# Patient Record
Sex: Male | Born: 1965 | Race: Black or African American | Hispanic: No | Marital: Single | Smoking: Never smoker
Health system: Southern US, Community
[De-identification: ages and names within clinical notes are randomized; demographics above are authoritative.]

## PROBLEM LIST (undated history)

## (undated) DIAGNOSIS — I480 Paroxysmal atrial fibrillation: Secondary | ICD-10-CM

## (undated) DIAGNOSIS — I469 Cardiac arrest, cause unspecified: Secondary | ICD-10-CM

## (undated) DIAGNOSIS — I10 Essential (primary) hypertension: Secondary | ICD-10-CM

## (undated) DIAGNOSIS — N289 Disorder of kidney and ureter, unspecified: Secondary | ICD-10-CM

## (undated) DIAGNOSIS — I509 Heart failure, unspecified: Secondary | ICD-10-CM

## (undated) DIAGNOSIS — I428 Other cardiomyopathies: Secondary | ICD-10-CM

## (undated) HISTORY — PX: CARDIAC DEFIBRILLATOR PLACEMENT: SHX171

---

## 2017-04-16 ENCOUNTER — Ambulatory Visit: Payer: Self-pay | Admitting: Physician Assistant

## 2017-04-16 DIAGNOSIS — Z0189 Encounter for other specified special examinations: Secondary | ICD-10-CM

## 2020-07-18 ENCOUNTER — Emergency Department (HOSPITAL_COMMUNITY): Payer: Medicare Other

## 2020-07-18 ENCOUNTER — Emergency Department (HOSPITAL_COMMUNITY)
Admission: EM | Admit: 2020-07-18 | Discharge: 2020-07-18 | Disposition: A | Discharge status: Home / Self Care | Payer: Medicare Other | Attending: Emergency Medicine | Admitting: Emergency Medicine

## 2020-07-18 ENCOUNTER — Other Ambulatory Visit: Payer: Self-pay

## 2020-07-18 ENCOUNTER — Encounter (HOSPITAL_COMMUNITY): Payer: Self-pay | Admitting: Physician Assistant

## 2020-07-18 DIAGNOSIS — I130 Hypertensive heart and chronic kidney disease with heart failure and stage 1 through stage 4 chronic kidney disease, or unspecified chronic kidney disease: Secondary | ICD-10-CM | POA: Insufficient documentation

## 2020-07-18 DIAGNOSIS — Z20822 Contact with and (suspected) exposure to covid-19: Secondary | ICD-10-CM | POA: Insufficient documentation

## 2020-07-18 DIAGNOSIS — N189 Chronic kidney disease, unspecified: Secondary | ICD-10-CM | POA: Insufficient documentation

## 2020-07-18 DIAGNOSIS — Z8616 Personal history of COVID-19: Secondary | ICD-10-CM | POA: Diagnosis not present

## 2020-07-18 DIAGNOSIS — J069 Acute upper respiratory infection, unspecified: Secondary | ICD-10-CM | POA: Diagnosis not present

## 2020-07-18 DIAGNOSIS — R059 Cough, unspecified: Secondary | ICD-10-CM | POA: Diagnosis present

## 2020-07-18 DIAGNOSIS — I509 Heart failure, unspecified: Secondary | ICD-10-CM | POA: Diagnosis not present

## 2020-07-18 LAB — RESP PANEL BY RT-PCR (FLU A&B, COVID) ARPGX2
Influenza A by PCR: NEGATIVE
Influenza B by PCR: NEGATIVE
SARS Coronavirus 2 by RT PCR: NEGATIVE

## 2020-07-18 MED ORDER — ALBUTEROL SULFATE HFA 108 (90 BASE) MCG/ACT IN AERS
6.0000 | INHALATION_SPRAY | Freq: Once | RESPIRATORY_TRACT | Status: AC
  Administered 2020-07-18: 6 via RESPIRATORY_TRACT

## 2020-07-18 MED ORDER — PREDNISONE 20 MG PO TABS
60.0000 mg | ORAL_TABLET | Freq: Once | ORAL | Status: AC
  Administered 2020-07-18: 60 mg via ORAL
  Filled 2020-07-18: qty 3

## 2020-07-18 MED ORDER — AEROCHAMBER Z-STAT PLUS/MEDIUM MISC
1.0000 | Freq: Once | Status: AC
  Administered 2020-07-18: 1
  Filled 2020-07-18: qty 1

## 2020-07-18 MED ORDER — AEROCHAMBER PLUS FLO-VU MEDIUM MISC: 1.0000 | Freq: Once | Status: DC

## 2020-07-18 MED ORDER — PREDNISONE 20 MG PO TABS: 20.0000 mg | ORAL_TABLET | Freq: Every day | ORAL | 0 refills | Status: AC

## 2020-07-18 MED ORDER — ALBUTEROL SULFATE HFA 108 (90 BASE) MCG/ACT IN AERS
6.0000 | INHALATION_SPRAY | Freq: Once | RESPIRATORY_TRACT | Status: AC
  Administered 2020-07-18: 6 via RESPIRATORY_TRACT
  Filled 2020-07-18: qty 6.7

## 2020-07-18 NOTE — Discharge Instructions (Addendum)
Take two puffs of the albuterol inhaler every 4-6 hours as needed for shortness of breath or wheezing.   Take prednisone as directed  Please follow up with your primary care provider within 5-7 days for re-evaluation of your symptoms. If you do not have a primary care provider, information for a healthcare clinic has been provided for you to make arrangements for follow up care. Please return to the emergency department for any new or worsening symptoms.

## 2020-07-18 NOTE — ED Notes (Signed)
Pt ambulated on RA, O2 saturation remained between 93-94%. Pt states he feels much better. Cortni, PA made aware.

## 2020-07-18 NOTE — ED Provider Notes (Signed)
[facility]-EMERGENCY DEPT Provider Note   CSN: [id number] Arrival date & time: 07/18/20  1208     History Chief Complaint  Patient presents with   Cough    William Henderson is a 55 y.o. male.  HPI   Pt is a 54/ y/o male with a hx of ICD, CKD, HTN, CHF, COVID, who presents to the ED today for eval of a cough that started last week. He reports cough is productive of sputum. Denies any associated fevers, rhinorrhea, congestion, sore throat, shortness of breath or chest pain. Has some chronic ble edema that is unchanged. States the his girlfriend has the same symptoms this morning. She was tested for covid but is not sure of the results.   History reviewed. No pertinent past medical history.  There are no problems to display for this patient.   History reviewed. No pertinent surgical history.     History reviewed. No pertinent family history.  Social History   Tobacco Use   Smoking status: Never   Smokeless tobacco: Never    Home Medications Prior to Admission medications   Medication Sig Start Date End Date Taking? Authorizing Provider  predniSONE (DELTASONE) 20 MG tablet Take 1 tablet (20 mg total) by mouth daily for 5 days. 07/18/20 07/23/20 Yes Camrin Gearheart S, PA-C    Allergies    Patient has no allergy information on record.  Review of Systems   Review of Systems  Constitutional:  Negative for fever.  HENT:  Negative for congestion, rhinorrhea and sore throat.   Respiratory:  Positive for cough. Negative for shortness of breath.   Cardiovascular:  Positive for leg swelling (chronic, unchanged). Negative for chest pain.  Gastrointestinal:  Negative for abdominal pain, constipation, diarrhea, nausea and vomiting.  Musculoskeletal:  Negative for back pain.  Neurological:  Negative for headaches.   Physical Exam Updated Vital Signs BP (!) 157/118   Pulse 78   Temp 99.5 F (37.5 C) (Oral)   Resp 16   Ht 5\' 10"  (1.778 m)   Wt 119.7 kg    SpO2 94%   BMI 37.88 kg/m   Physical Exam Vitals and nursing note reviewed.  Constitutional:      Appearance: He is well-developed.  HENT:     Head: Normocephalic and atraumatic.  Eyes:     Conjunctiva/sclera: Conjunctivae normal.  Cardiovascular:     Rate and Rhythm: Normal rate and regular rhythm.     Heart sounds: Normal heart sounds. No murmur heard. Pulmonary:     Effort: Pulmonary effort is normal.     Breath sounds: Wheezing present.     Comments: Diffuse expiratory wheezing Abdominal:     General: Bowel sounds are normal.     Palpations: Abdomen is soft.     Tenderness: There is no abdominal tenderness. There is no guarding or rebound.  Musculoskeletal:     Cervical back: Neck supple.     Comments: Trace ble edema, no calf ttp  Skin:    General: Skin is warm and dry.  Neurological:     Mental Status: He is alert.    ED Results / Procedures / Treatments   Labs (all labs ordered are listed, but only abnormal results are displayed) Labs Reviewed  RESP PANEL BY RT-PCR (FLU A&B, COVID) [id number]    EKG None  Radiology DG Chest 2 View  Result Date: 07/18/2020 CLINICAL DATA:  Cough EXAM: CHEST - 2 VIEW COMPARISON:  None. FINDINGS: The heart size and mediastinal contours  are within normal limits. Both lungs are clear. The visualized skeletal structures are unremarkable. Defibrillator is noted. IMPRESSION: No active cardiopulmonary disease. Electronically Signed   By: Alcide Clever M.D.   On: 07/18/2020 13:45    Procedures Procedures   Medications Ordered in ED Medications  predniSONE (DELTASONE) tablet 60 mg (60 mg Oral Given 07/18/20 1317)  albuterol (VENTOLIN HFA) 108 (90 Base) MCG/ACT inhaler 6 puff (6 puffs Inhalation Given 07/18/20 1316)  aerochamber Z-Stat Plus/medium 1 each (1 each Other Given 07/18/20 1317)  albuterol (VENTOLIN HFA) 108 (90 Base) MCG/ACT inhaler 6 puff (6 puffs Inhalation Given 07/18/20 1426)    ED Course  I have reviewed the triage vital  signs and the nursing notes.  Pertinent labs & imaging results that were available during my care of the patient were reviewed by me and considered in my medical decision making (see chart for details).    MDM Rules/Calculators/A&P                          55 y/o M presenting for eval of cough that started last week. Has been around sick contacts with similar. Denies sob, cp, or feeling fluid overloaded. Lung exam reveals diffuse wheezing throughout. He is not tachypneic and is speaking in full sentences.   CXR neg, no pulm edema COVID test neg  Patient was given albuterol and prednisone in the emergency department.  He was ambulated and did not have any hypoxia.  He states he feels improved.  We will help treat him for suspected viral uri and give a short course of steroids and albuterol.  Have advised him to follow-up with PCP and return to the ED for any new or worsening symptoms.  He voiced understanding of plan and reasons to return.  All questions answered.  Patient stable for discharge.  Final Clinical Impression(s) / ED Diagnoses Final diagnoses:  Viral URI with cough    Rx / DC Orders ED Discharge Orders          Ordered    predniSONE (DELTASONE) 20 MG tablet  Daily        07/18/20 1452             Etta Gassett S, PA-C 07/18/20 1453    Derwood Kaplan, MD 07/19/20 [phone]

## 2020-07-18 NOTE — ED Triage Notes (Signed)
Starting 07/12/20 pt reports productive cough, feeling uncomfortable, and pt unable to sleep last night due to cough.

## 2020-07-18 NOTE — ED Notes (Signed)
Pt states he did not take his BP medications today.

## 2020-10-19 ENCOUNTER — Emergency Department (HOSPITAL_COMMUNITY)
Admission: EM | Admit: 2020-10-19 | Discharge: 2020-10-19 | Disposition: A | Discharge status: Home / Self Care | Payer: Medicare Other | Attending: Emergency Medicine | Admitting: Emergency Medicine

## 2020-10-19 ENCOUNTER — Encounter (HOSPITAL_COMMUNITY): Payer: Self-pay

## 2020-10-19 ENCOUNTER — Other Ambulatory Visit: Payer: Self-pay

## 2020-10-19 DIAGNOSIS — Z5321 Procedure and treatment not carried out due to patient leaving prior to being seen by health care provider: Secondary | ICD-10-CM | POA: Insufficient documentation

## 2020-10-19 DIAGNOSIS — R0602 Shortness of breath: Secondary | ICD-10-CM | POA: Insufficient documentation

## 2020-10-19 NOTE — ED Triage Notes (Signed)
Pt states that his O2 saturation at home was reading 85%. Upon triage O2 saturation is 95%-99%.

## 2020-11-22 ENCOUNTER — Other Ambulatory Visit: Payer: Self-pay

## 2020-11-22 ENCOUNTER — Encounter (HOSPITAL_COMMUNITY): Payer: Self-pay

## 2020-11-22 ENCOUNTER — Emergency Department (HOSPITAL_COMMUNITY)
Admission: EM | Admit: 2020-11-22 | Discharge: 2020-11-22 | Disposition: A | Discharge status: Home / Self Care | Payer: Medicare Other | Attending: Emergency Medicine | Admitting: Emergency Medicine

## 2020-11-22 ENCOUNTER — Emergency Department (HOSPITAL_COMMUNITY): Payer: Medicare Other

## 2020-11-22 DIAGNOSIS — I4819 Other persistent atrial fibrillation: Secondary | ICD-10-CM | POA: Insufficient documentation

## 2020-11-22 DIAGNOSIS — Z7901 Long term (current) use of anticoagulants: Secondary | ICD-10-CM | POA: Insufficient documentation

## 2020-11-22 DIAGNOSIS — I110 Hypertensive heart disease with heart failure: Secondary | ICD-10-CM | POA: Diagnosis not present

## 2020-11-22 DIAGNOSIS — R0602 Shortness of breath: Secondary | ICD-10-CM | POA: Diagnosis present

## 2020-11-22 DIAGNOSIS — I509 Heart failure, unspecified: Secondary | ICD-10-CM | POA: Insufficient documentation

## 2020-11-22 DIAGNOSIS — I4811 Longstanding persistent atrial fibrillation: Secondary | ICD-10-CM

## 2020-11-22 HISTORY — DX: Paroxysmal atrial fibrillation: I48.0

## 2020-11-22 HISTORY — DX: Essential (primary) hypertension: I10

## 2020-11-22 HISTORY — DX: Cardiac arrest, cause unspecified: I46.9

## 2020-11-22 HISTORY — DX: Other cardiomyopathies: I42.8

## 2020-11-22 HISTORY — DX: Disorder of kidney and ureter, unspecified: N28.9

## 2020-11-22 HISTORY — DX: Heart failure, unspecified: I50.9

## 2020-11-22 LAB — BASIC METABOLIC PANEL
Anion gap: 8 (ref 5–15)
BUN: 17 mg/dL (ref 6–20)
CO2: 29 mmol/L (ref 22–32)
Calcium: 9.2 mg/dL (ref 8.9–10.3)
Chloride: 103 mmol/L (ref 98–111)
Creatinine, Ser: 1.66 mg/dL — ABNORMAL HIGH (ref 0.61–1.24)
GFR, Estimated: 48 mL/min — ABNORMAL LOW (ref >60)
Glucose, Bld: 128 mg/dL — ABNORMAL HIGH (ref 70–99)
Potassium: 3.5 mmol/L (ref 3.5–5.1)
Sodium: 140 mmol/L (ref 135–145)

## 2020-11-22 LAB — CBC WITH DIFFERENTIAL/PLATELET
Abs Immature Granulocytes: 0.03 10*3/uL (ref 0.00–0.07)
Basophils Absolute: 0 10*3/uL (ref 0.0–0.1)
Basophils Relative: 0 %
Eosinophils Absolute: 0.1 10*3/uL (ref 0.0–0.5)
Eosinophils Relative: 2 %
HCT: 46.3 % (ref 39.0–52.0)
Hemoglobin: 15 g/dL (ref 13.0–17.0)
Immature Granulocytes: 0 %
Lymphocytes Relative: 28 %
Lymphs Abs: 2.2 10*3/uL (ref 0.7–4.0)
MCH: 31.6 pg (ref 26.0–34.0)
MCHC: 32.4 g/dL (ref 30.0–36.0)
MCV: 97.7 fL (ref 80.0–100.0)
Monocytes Absolute: 0.5 10*3/uL (ref 0.1–1.0)
Monocytes Relative: 6 %
Neutro Abs: 5.1 10*3/uL (ref 1.7–7.7)
Neutrophils Relative %: 64 %
Platelets: 161 10*3/uL (ref 150–400)
RBC: 4.74 MIL/uL (ref 4.22–5.81)
RDW: 15.8 % — ABNORMAL HIGH (ref 11.5–15.5)
WBC: 8 10*3/uL (ref 4.0–10.5)
nRBC: 0 % (ref 0.0–0.2)

## 2020-11-22 LAB — TROPONIN I (HIGH SENSITIVITY)
Troponin I (High Sensitivity): 48 ng/L — ABNORMAL HIGH (ref <18)
Troponin I (High Sensitivity): 46 ng/L — ABNORMAL HIGH (ref <18)

## 2020-11-22 LAB — BRAIN NATRIURETIC PEPTIDE: B Natriuretic Peptide: 701.3 pg/mL — ABNORMAL HIGH (ref 0.0–100.0)

## 2020-11-22 MED ORDER — APIXABAN 2.5 MG PO TABS: 2.5000 mg | ORAL_TABLET | Freq: Two times a day (BID) | ORAL | 0 refills | Status: DC

## 2020-11-22 NOTE — ED Provider Notes (Signed)
[facility] EMERGENCY DEPARTMENT Provider Note  CSN: [id number] Arrival date & time: 11/22/20 1051    History Chief Complaint  Patient presents with   Shortness of Breath   Palpitations    William Henderson is a 55 y.o. 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 He has been diagnosed with NSVT and pAF by interrogation of his device in the past. 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 He denies any chest pains but feels his heart pounding. He has moved several times in recent years and has inconsistent cardiology follow up. He is currently taking amlodipine, coreg, lasix and lisinopril but no longer taking amiodarone or eliquis (and he isn't sure why except to say it hasn't bene prescribed to him). Has had chronic LE edema, controlled with lasix and compression stockings. He has also been told he likely has OSA but has not yet had a formal sleep study.    Past Medical History:  Diagnosis Date   Cardiac arrest [facility])    CHF (congestive heart failure) (HCC)    Hypertension    Nonischemic cardiomyopathy (HCC)    Paroxysmal atrial fibrillation (HCC)    Renal disorder     Past Surgical History:  Procedure Laterality Date   CARDIAC DEFIBRILLATOR PLACEMENT      History reviewed. No pertinent family history.  Social History   Tobacco Use   Smoking status: Never   Smokeless tobacco: Never  Vaping Use   Vaping Use: Never used  Substance Use Topics   Alcohol use: Never   Drug use: Never     Home Medications Prior to Admission medications   Medication Sig Start Date End Date Taking? Authorizing Provider  apixaban (ELIQUIS) 2.5 MG TABS tablet Take 1 tablet (2.5 mg total) by mouth 2 (two) times daily. 11/22/20 12/22/20 Yes  Pollyann Savoy, MD     Allergies    Other   Review of Systems   Review of Systems A comprehensive review of systems was completed and negative except as noted in HPI.    Physical Exam BP (!) 118/98 (BP Location: Right Arm)   Pulse (!) 54   Temp 97.6 F (36.4 C) (Oral)   Resp (!) 25   Ht 5\' 10"  (1.778 m)   Wt 124.7 kg   SpO2 97%   BMI 39.46 kg/m   Physical Exam Vitals and nursing note reviewed.  Constitutional:      Appearance: Normal appearance.  HENT:     Head: Normocephalic and atraumatic.     Nose: Nose normal.     Mouth/Throat:     Mouth: Mucous membranes are moist.  Eyes:     Extraocular Movements: Extraocular movements intact.     Conjunctiva/sclera: Conjunctivae normal.  Cardiovascular:     Rate and Rhythm: Normal rate. Rhythm irregular.  Pulmonary:     Effort: Pulmonary effort is normal.     Breath sounds: Normal breath sounds.  Abdominal:     General: Abdomen is flat.     Palpations: Abdomen is soft.     Tenderness: There is no abdominal tenderness.  Musculoskeletal:        General: No swelling. Normal range of motion.     Cervical back: Neck supple.     Right lower leg:  Edema (trace) present.     Left lower leg: Edema (trace) present.  Skin:    General: Skin is warm and dry.  Neurological:     General: No focal deficit present.     Mental Status: He is alert.  Psychiatric:        Mood and Affect: Mood normal.     ED Results / Procedures / Treatments   Labs (all labs ordered are listed, but only abnormal results are displayed) Labs Reviewed  BRAIN NATRIURETIC PEPTIDE - Abnormal; Notable for the following components:      Result Value   B Natriuretic Peptide 701.3 (*)    All other components within normal limits  CBC WITH DIFFERENTIAL/PLATELET - Abnormal; Notable for the following components:   RDW 15.8 (*)    All other components within normal limits  BASIC METABOLIC PANEL - Abnormal; Notable for the following components:    Glucose, Bld 128 (*)    Creatinine, Ser 1.66 (*)    GFR, Estimated 48 (*)    All other components within normal limits  TROPONIN I (HIGH SENSITIVITY) - Abnormal; Notable for the following components:   Troponin I (High Sensitivity) 48 (*)    All other components within normal limits  TROPONIN I (HIGH SENSITIVITY) - Abnormal; Notable for the following components:   Troponin I (High Sensitivity) 46 (*)    All other components within normal limits    EKG EKG Interpretation  Date/Time:  Tuesday November 22 2020 11:10:34 EDT Ventricular Rate:  87 PR Interval:    QRS Duration: 103 QT Interval:  440 QTC Calculation: 530 R Axis:   242 Text Interpretation: Atrial fibrillation LAD, consider left anterior fascicular block Abnormal lateral Q waves Anterior infarct, old Prolonged QT interval No old tracing to compare Confirmed by Susy Frizzle [phone]) on 11/22/2020 11:40:43 AM  Radiology DG Chest 2 View  Result Date: 11/22/2020 CLINICAL DATA:  Shortness of breath EXAM: CHEST - 2 VIEW COMPARISON:  07/18/2020 FINDINGS: Cardiomegaly with left chest single lead pacer defibrillator. Both lungs are clear. The visualized skeletal structures are unremarkable. IMPRESSION: Cardiomegaly without acute abnormality of the lungs. Electronically Signed   By: Jearld Lesch M.D.   On: 11/22/2020 11:52    Procedures Procedures  Medications Ordered in the ED Medications - No data to display   MDM Rules/Calculators/A&P MDM Patient is currently in afib but relatively rate controlled. Will check labs and CXR, interrogate AICD (he states it is [employer]). Continue to monitor.   ED Course  I have reviewed the triage vital signs and the nursing notes.  Pertinent labs & imaging results that were available during my care of the patient were reviewed by me and considered in my medical decision making (see chart for details).  Clinical Course as of 11/22/20 1515  Tue Nov 22, 2020  1206 Spoke with [employer]  rep. Patient has not had his device interrogated in-person in about 2 years. He had one episode of NSVT that did not reach threshold for treatment this morning. He has been in afib for quite some time, more than just a few days.  [CS]  1207 CXR with cardiomegaly, no edema [CS]  1225 CBC is normal.  [CS]  1254 BMP with CKD about at baseline from old labs. Trop is mildly elevated without old to compare. Will check a delta for trending.  [CS]  1336 BNP is moderately elevated, unclear baseline.  [CS]  1456 Repeat Trop is not significantly changed from initial.  [CS]  1507 Patient continues to rest comfortably. Will recommend he double his lasix for the next 3 days to see if that helps with his SOB. Restart Eliquis for stroke prevention, CHADVASc is 2. Recommend close follow up with his cardiologist in [facility] for re-evaluation and long term management of his symptoms.  [CS]    Clinical Course User Index [CS] Pollyann Savoy, MD    Final Clinical Impression(s) / ED Diagnoses Final diagnoses:  Longstanding persistent atrial fibrillation (HCC)  Acute on chronic congestive heart failure, unspecified heart failure type [facility])    Rx / DC Orders ED Discharge Orders          Ordered    apixaban (ELIQUIS) 2.5 MG TABS tablet  2 times daily        11/22/20 1512             Pollyann Savoy, MD 11/22/20 1515

## 2020-11-22 NOTE — ED Triage Notes (Signed)
Patient c/o intermittent SOB and palpitations x 3 days

## 2020-11-22 NOTE — Discharge Instructions (Addendum)
Please double your lasix for the next three days. Begin taking Eliquis and follow up with Dr. Chales Abrahams for long term management of your heart disease. Please avoid exertion including heavy lifting, strenuous dancing/singing in the meantime.

## 2021-01-24 ENCOUNTER — Emergency Department (HOSPITAL_COMMUNITY): Payer: Medicare Other

## 2021-01-24 ENCOUNTER — Observation Stay (HOSPITAL_COMMUNITY)
Admission: EM | Admit: 2021-01-24 | Discharge: 2021-01-26 | Disposition: A | Discharge status: Home / Self Care | Payer: Medicare Other | Attending: Family Medicine | Admitting: Family Medicine

## 2021-01-24 DIAGNOSIS — I130 Hypertensive heart and chronic kidney disease with heart failure and stage 1 through stage 4 chronic kidney disease, or unspecified chronic kidney disease: Secondary | ICD-10-CM | POA: Diagnosis not present

## 2021-01-24 DIAGNOSIS — I4891 Unspecified atrial fibrillation: Secondary | ICD-10-CM

## 2021-01-24 DIAGNOSIS — I639 Cerebral infarction, unspecified: Secondary | ICD-10-CM | POA: Diagnosis not present

## 2021-01-24 DIAGNOSIS — I5023 Acute on chronic systolic (congestive) heart failure: Secondary | ICD-10-CM | POA: Diagnosis not present

## 2021-01-24 DIAGNOSIS — E785 Hyperlipidemia, unspecified: Secondary | ICD-10-CM | POA: Diagnosis present

## 2021-01-24 DIAGNOSIS — G459 Transient cerebral ischemic attack, unspecified: Secondary | ICD-10-CM | POA: Diagnosis present

## 2021-01-24 DIAGNOSIS — I509 Heart failure, unspecified: Secondary | ICD-10-CM

## 2021-01-24 DIAGNOSIS — N179 Acute kidney failure, unspecified: Secondary | ICD-10-CM | POA: Diagnosis present

## 2021-01-24 DIAGNOSIS — R531 Weakness: Secondary | ICD-10-CM | POA: Diagnosis present

## 2021-01-24 DIAGNOSIS — Z950 Presence of cardiac pacemaker: Secondary | ICD-10-CM | POA: Insufficient documentation

## 2021-01-24 DIAGNOSIS — M6281 Muscle weakness (generalized): Secondary | ICD-10-CM | POA: Diagnosis not present

## 2021-01-24 DIAGNOSIS — I480 Paroxysmal atrial fibrillation: Secondary | ICD-10-CM | POA: Diagnosis present

## 2021-01-24 DIAGNOSIS — Z20822 Contact with and (suspected) exposure to covid-19: Secondary | ICD-10-CM | POA: Diagnosis not present

## 2021-01-24 DIAGNOSIS — N1832 Chronic kidney disease, stage 3b: Secondary | ICD-10-CM | POA: Diagnosis not present

## 2021-01-24 DIAGNOSIS — Z7901 Long term (current) use of anticoagulants: Secondary | ICD-10-CM | POA: Insufficient documentation

## 2021-01-24 DIAGNOSIS — Z79899 Other long term (current) drug therapy: Secondary | ICD-10-CM | POA: Diagnosis not present

## 2021-01-24 DIAGNOSIS — I10 Essential (primary) hypertension: Secondary | ICD-10-CM | POA: Diagnosis present

## 2021-01-24 LAB — COMPREHENSIVE METABOLIC PANEL
ALT: 17 U/L (ref 0–44)
AST: 22 U/L (ref 15–41)
Albumin: 3.5 g/dL (ref 3.5–5.0)
Alkaline Phosphatase: 46 U/L (ref 38–126)
Anion gap: 11 (ref 5–15)
BUN: 20 mg/dL (ref 6–20)
CO2: 22 mmol/L (ref 22–32)
Calcium: 9.1 mg/dL (ref 8.9–10.3)
Chloride: 105 mmol/L (ref 98–111)
Creatinine, Ser: 1.98 mg/dL — ABNORMAL HIGH (ref 0.61–1.24)
GFR, Estimated: 39 mL/min — ABNORMAL LOW (ref >60)
Glucose, Bld: 117 mg/dL — ABNORMAL HIGH (ref 70–99)
Potassium: 3.6 mmol/L (ref 3.5–5.1)
Sodium: 138 mmol/L (ref 135–145)
Total Bilirubin: 1.4 mg/dL — ABNORMAL HIGH (ref 0.3–1.2)
Total Protein: 7.8 g/dL (ref 6.5–8.1)

## 2021-01-24 LAB — I-STAT CHEM 8, ED
BUN: 21 mg/dL — ABNORMAL HIGH (ref 6–20)
Calcium, Ion: 1 mmol/L — ABNORMAL LOW (ref 1.15–1.40)
Chloride: 106 mmol/L (ref 98–111)
Creatinine, Ser: 1.9 mg/dL — ABNORMAL HIGH (ref 0.61–1.24)
Glucose, Bld: 115 mg/dL — ABNORMAL HIGH (ref 70–99)
HCT: 50 % (ref 39.0–52.0)
Hemoglobin: 17 g/dL (ref 13.0–17.0)
Potassium: 3.4 mmol/L — ABNORMAL LOW (ref 3.5–5.1)
Sodium: 140 mmol/L (ref 135–145)
TCO2: 22 mmol/L (ref 22–32)

## 2021-01-24 LAB — DIFFERENTIAL
Abs Immature Granulocytes: 0.03 10*3/uL (ref 0.00–0.07)
Basophils Absolute: 0 10*3/uL (ref 0.0–0.1)
Basophils Relative: 0 %
Eosinophils Absolute: 0.1 10*3/uL (ref 0.0–0.5)
Eosinophils Relative: 1 %
Immature Granulocytes: 0 %
Lymphocytes Relative: 23 %
Lymphs Abs: 2.1 10*3/uL (ref 0.7–4.0)
Monocytes Absolute: 0.9 10*3/uL (ref 0.1–1.0)
Monocytes Relative: 10 %
Neutro Abs: 5.9 10*3/uL (ref 1.7–7.7)
Neutrophils Relative %: 66 %

## 2021-01-24 LAB — RESP PANEL BY RT-PCR (FLU A&B, COVID) ARPGX2
Influenza A by PCR: NEGATIVE
Influenza B by PCR: NEGATIVE
SARS Coronavirus 2 by RT PCR: NEGATIVE

## 2021-01-24 LAB — CBC
HCT: 49.1 % (ref 39.0–52.0)
Hemoglobin: 15.8 g/dL (ref 13.0–17.0)
MCH: 31.3 pg (ref 26.0–34.0)
MCHC: 32.2 g/dL (ref 30.0–36.0)
MCV: 97.2 fL (ref 80.0–100.0)
Platelets: 181 10*3/uL (ref 150–400)
RBC: 5.05 MIL/uL (ref 4.22–5.81)
RDW: 15.4 % (ref 11.5–15.5)
WBC: 9 10*3/uL (ref 4.0–10.5)
nRBC: 0.2 % (ref 0.0–0.2)

## 2021-01-24 LAB — PROTIME-INR
INR: 1.3 — ABNORMAL HIGH (ref 0.8–1.2)
Prothrombin Time: 15.8 seconds — ABNORMAL HIGH (ref 11.4–15.2)

## 2021-01-24 LAB — CBG MONITORING, ED: Glucose-Capillary: 123 mg/dL — ABNORMAL HIGH (ref 70–99)

## 2021-01-24 LAB — APTT: aPTT: 33 seconds (ref 24–36)

## 2021-01-24 MED ORDER — FUROSEMIDE 10 MG/ML IJ SOLN
40.0000 mg | Freq: Once | INTRAMUSCULAR | Status: AC
  Administered 2021-01-24: 21:00:00 40 mg via INTRAVENOUS
  Filled 2021-01-24: qty 4

## 2021-01-24 MED ORDER — IOHEXOL 350 MG/ML SOLN
75.0000 mL | Freq: Once | INTRAVENOUS | Status: AC | PRN
  Administered 2021-01-24: 20:00:00 75 mL via INTRAVENOUS

## 2021-01-24 MED ORDER — LABETALOL HCL 5 MG/ML IV SOLN
10.0000 mg | Freq: Once | INTRAVENOUS | Status: AC
  Administered 2021-01-24: 21:00:00 10 mg via INTRAVENOUS
  Filled 2021-01-24: qty 4

## 2021-01-24 MED ORDER — SODIUM CHLORIDE 0.9% FLUSH
3.0000 mL | Freq: Once | INTRAVENOUS | Status: AC
Start: 2021-01-24 — End: 2021-01-24
  Administered 2021-01-24: 20:00:00 3 mL via INTRAVENOUS

## 2021-01-24 NOTE — H&P (Addendum)
[facility] Admission History and Physical Service Pager: [phone]  Patient name: William Henderson Medical record number: [id number] Date of birth: 1965-12-06 Age: 55 y.o. Gender: male  Primary Care Provider: Candi Leash, PA-C Consultants: Neurology Code Status: Full Preferred Emergency Contact:  Primary Emergency Contact: Joslyn Devon [phone]  Chief Complaint: Right-sided weakness  Assessment and Plan: William Henderson is a 55 y.o. male who presented with sudden transient right-sided weakness. 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  Right sided weakness (resolved) 2/2 suspected TIA v. Stroke work up Patient presented via EMS with right-sided weakness. Last known normal 1750. He works from home and was walking back to computer when he could not feel his right leg and fell. He denies hitting his head. He states he crawled to the phone and called EMS. Code stroke alerted and neuro consulted in ED. He has a history of A. fib on Eliquis, however patient has been taking it once daily due to cost.  His last dose was on 12/19.  No neuro deficit on admission exam.  CXR was normal.  Head CT & CT head and neck was negative for acute bleed. Received lasix 40 and IV labetalol x1 in ED. Plan to obtain MRI brain without contrast tomorrow, ICD will need to be turned off.  Patient passed bedside swallow, will start patient on diet.  Patient admitted to progressive with attending Pray, Norwood Levo, MD  Neurology following, appreciate assistance recommendations Vitals per unit routine Cardiac monitoring Frequent neuro checks PT/OT/SLP eval and treat Diet: Heart healthy Holding home antihypertensives, permissive hypertension (< 220/120)  MRI brain without contrast 12/21 Follow-up CBC, BMP, A1c, lipid panel, UDS Started aspirin 81 mg daily VTE  prophylaxis  Paroxysmal atrial fibrillation (incorrectly used Eliquis) Currently in A. fib, seen on EKG. Home med Eliquis 2.5 mg BID, amiodarone 200 mg daily.  Patient has been taking Eliquis only once a day.  Holding Eliquis Amiodarone continued at this time.  Received labetalol 10 mg in the ED  AoCKD 3B Suspect 2/2 contrast from imaging and IV lasix. Cr (!) 1.90 (baseline Cr 1.6).  Monitor on AM BMP Holding antihypertensives per above  HFimpEF (EF 45-50%, 02/2020)   nonischemic cardiomyopathy   h/o cardiac arrest s/p ICD  EF <25% in 06/2019, now EF 45-55% on 02/2020. Per ICD report earlier before encounter that patient was volume overloaded, to which she received IV Lasix 40 mg x 1 in the ED, with good urine output.  On physical exam, patient appeared euvolemic. Home meds are Lasix 80 mg, lisinopril 10 mg, carvedilol 25 mg.   Strict I/Os and daily weights BMP daily K+ >4, replete as needed  Received IV Lasix 40 mg in the ED; discuss re-dosing lasix in the morning  HTN Home lisinopril 10 mg daily, amlodipine 10 mg daily Holding home meds per above  HLD Home rosuvastatin 20 mg daily Holding home meds per neuro recs below Obtaining lipid panel, initiate statin if LDL >70  FEN/GI: Heart healthy Prophylaxis: Enoxaparin (lovenox) injection 40 mg start: 01/25/21 1000   Disposition:  Progressive- Neuro checks  History of Present Illness:   William Henderson is a 55 y.o. male presenting with right sided weakness after lunch break (works from home) where he got up to walk and right leg had no feeling and fell on to coffee table. Denies hitting head and loss of consciousness. He fell and was able to crawl to  the phone to called for help.  Reported that he had no use of his right upper and lower extremities, that in the ED he had some residual right lower extremity weakness.  However this has resolved. Patient stated that he take his medications daily, however only takes Eliquis once daily, as it  is expensive.  Patient said he has not taken his medicines today.  Review Of Systems:  Per HPI with the following additions: Review of Systems  Constitutional:  Negative for chills, diaphoresis, fatigue and fever.  Eyes:  Negative for visual disturbance.  Respiratory:  Positive for shortness of breath. Negative for cough, choking, chest tightness, wheezing and stridor.   Cardiovascular:  Negative for chest pain and palpitations.  Gastrointestinal:  Negative for blood in stool, constipation, diarrhea, nausea and vomiting.  Genitourinary:  Negative for difficulty urinating.  Neurological:  Negative for dizziness, syncope, speech difficulty, light-headedness and headaches.    Patient Active Problem List   Diagnosis Date Noted   TIA (transient ischemic attack) 01/25/2021    Past Medical History: Past Medical History:  Diagnosis Date   Cardiac arrest ([place])    CHF (congestive heart failure) (HCC)    Hypertension    Nonischemic cardiomyopathy (HCC)    Paroxysmal atrial fibrillation (HCC)    Renal disorder     Past Surgical History: Past Surgical History:  Procedure Laterality Date   CARDIAC DEFIBRILLATOR PLACEMENT      Social History: Social History   Tobacco Use   Smoking status: Never   Smokeless tobacco: Never  Vaping Use   Vaping Use: Never used  Substance Use Topics   Alcohol use: Never   Drug use: Never   Please also refer to relevant sections of EMR.  Family History: No family history on file.  Allergies and Medications: Allergies  Allergen Reactions   Cephalexin Hives   No current facility-administered medications on file prior to encounter.   Current Outpatient Medications on File Prior to Encounter  Medication Sig Dispense Refill   amLODipine (NORVASC) 10 MG tablet Take 10 mg by mouth at bedtime.     apixaban (ELIQUIS) 2.5 MG TABS tablet Take 1 tablet (2.5 mg total) by mouth 2 (two) times daily. 60 tablet 0   carvedilol (COREG) 25 MG tablet Take 25 mg  by mouth at bedtime.     furosemide (LASIX) 40 MG tablet Take 40 mg by mouth at bedtime.     lisinopril (ZESTRIL) 10 MG tablet Take 10 mg by mouth at bedtime.     PACERONE 200 MG tablet Take 200 mg by mouth at bedtime.     rosuvastatin (CRESTOR) 20 MG tablet Take 20 mg by mouth at bedtime.      Objective: BP (!) 123/99 (BP Location: Right Arm)    Pulse 99    Temp 98.9 F (37.2 C) (Oral)    Resp 20    SpO2 100%  Exam: Physical Exam Vitals and nursing note reviewed.  Constitutional:      General: He is awake. He is not in acute distress.    Appearance: He is not ill-appearing, toxic-appearing or diaphoretic.  HENT:     Head: Normocephalic and atraumatic.  Eyes:     Extraocular Movements: Extraocular movements intact.     Conjunctiva/sclera: Conjunctivae normal.     Pupils: Pupils are equal, round, and reactive to light.  Cardiovascular:     Rate and Rhythm: Tachycardia present. Rhythm irregularly irregular.     Pulses:  Dorsalis pedis pulses are 1+ on the right side and 1+ on the left side.     Heart sounds: Normal heart sounds. No murmur heard.   No friction rub. No gallop.  Pulmonary:     Effort: Pulmonary effort is normal. No respiratory distress.     Breath sounds: Normal breath sounds. No wheezing or rales.  Abdominal:     Palpations: Abdomen is soft.     Tenderness: There is no abdominal tenderness. There is no guarding or rebound.  Musculoskeletal:     Right lower leg: No edema.     Left lower leg: No edema.  Skin:    General: Skin is warm and dry.  Neurological:     General: No focal deficit present.     Mental Status: He is alert and oriented to person, place, and time.     Cranial Nerves: No cranial nerve deficit, dysarthria or facial asymmetry.     Sensory: Sensation is intact. No sensory deficit.     Motor: No weakness, tremor, abnormal muscle tone or pronator drift.  Psychiatric:        Behavior: Behavior is cooperative.    Labs and Imaging: CBC  BMET  Recent Labs  Lab 01/24/21 1930 01/24/21 1943  WBC 9.0  --   HGB 15.8 17.0  HCT 49.1 50.0  PLT 181  --    Recent Labs  Lab 01/24/21 1930 01/24/21 1943  NA 138 140  K 3.6 3.4*  CL 105 106  CO2 22  --   BUN 20 21*  CREATININE 1.98* 1.90*  GLUCOSE 117* 115*  CALCIUM 9.1  --      EKG: A. fib, QTc 489, LAD, consider LAFB or inferior infarct, abnormal lateral Q waves, ST elevation, consider inferior injury    DG Chest Portable 1 View  Result Date: 01/24/2021 CLINICAL DATA:  Shortness of breath EXAM: PORTABLE CHEST 1 VIEW COMPARISON:  11/22/2020 FINDINGS: Cardiac shadow is enlarged in size. Defibrillator is again noted and stable. The lungs are well aerated bilaterally. No focal infiltrate or effusion is seen. No bony abnormality is noted. IMPRESSION: No acute abnormality noted. Electronically Signed   By: Inez Catalina M.D.   On: 01/24/2021 20:49   CT HEAD CODE STROKE WO CONTRAST  Result Date: 01/24/2021 CLINICAL DATA:  Code stroke. Initial evaluation for acute right-sided weakness. EXAM: CT HEAD WITHOUT CONTRAST TECHNIQUE: Contiguous axial images were obtained from the base of the skull through the vertex without intravenous contrast. COMPARISON:  None available. FINDINGS: Brain: Cerebral volume within normal limits for age. Extensive patchy and confluent hypodensity involving the supratentorial white matter, most likely related chronic microvascular ischemic disease, advanced for age. Superimposed remote lacunar infarct at the posterior right frontal corona radiata. Small remote left cerebellar infarct noted. No acute intracranial hemorrhage. No acute large vessel territory infarct. No mass lesion, midline shift or mass effect. No hydrocephalus or are extra-axial fluid collection. Vascular: No hyperdense vessel. Skull: Scalp soft tissues and calvarium within normal limits. Sinuses/Orbits: Globes orbital soft tissues within normal limits. Chronic left maxillary sinusitis noted.  Paranasal sinuses are otherwise clear. No mastoid effusion. Other: None. ASPECTS [facility] Stroke Program Early CT Score) - Ganglionic level infarction (caudate, lentiform nuclei, internal capsule, insula, M1-M3 cortex): 7 - Supraganglionic infarction (M4-M6 cortex): 3 Total score (0-10 with 10 being normal): 10 IMPRESSION: 1. No acute intracranial abnormality. 2. ASPECTS is 10. 3. Advanced chronic microvascular ischemic disease for age, with small remote infarcts involving the posterior right frontal  corona radiata and left cerebellum. These results were communicated to Dr. Derry Lory at 7:44 pm on 01/24/2021 by text page via the [facility] messaging system. Electronically Signed   By: Rise Mu M.D.   On: 01/24/2021 19:47   CT ANGIO HEAD NECK W WO CM (CODE STROKE)  Result Date: 01/24/2021 CLINICAL DATA:  Initial evaluation for neuro deficit, stroke suspected. EXAM: CT ANGIOGRAPHY HEAD AND NECK TECHNIQUE: Multidetector CT imaging of the head and neck was performed using the standard protocol during bolus administration of intravenous contrast. Multiplanar CT image reconstructions and MIPs were obtained to evaluate the vascular anatomy. Carotid stenosis measurements (when applicable) are obtained utilizing NASCET criteria, using the distal internal carotid diameter as the denominator. CONTRAST:  63mL OMNIPAQUE IOHEXOL 350 MG/ML SOLN COMPARISON:  Prior head CT from earlier the same day. FINDINGS: CTA NECK FINDINGS Aortic arch: Visualized aortic arch normal caliber with normal branch pattern. No stenosis about the origin of the great vessels. Right carotid system: Right common and internal carotid arteries widely patent without stenosis, dissection or occlusion. Left carotid system: Left common and internal carotid arteries widely patent without stenosis, dissection or occlusion. Vertebral arteries: Left vertebral artery arises directly from the aortic arch. Right vertebral artery slightly dominant.  Vertebral arteries patent without stenosis, dissection or occlusion. Skeleton: No discrete or worrisome osseous lesions. Mild-to-moderate spondylosis present at C4-5 through C6-7. Other neck: No other acute soft tissue abnormality within the neck. Small intramuscular lipoma noted at the left posterolateral neck. No other mass or adenopathy. Upper chest: Left-sided pacemaker/AICD in place. Small layering bilateral pleural effusions. Scattered ground-glass opacity with interlobular septal thickening within the visualized lungs consistent with pulmonary interstitial edema. Review of the MIP images confirms the above findings CTA HEAD FINDINGS Anterior circulation: Both internal carotid arteries widely patent to the termini without stenosis. A1 segments widely patent. Normal anterior communicating artery complex. Both anterior cerebral arteries widely patent to their distal aspects without stenosis. No M1 stenosis or occlusion. Normal MCA bifurcations. Distal MCA branches well perfused and symmetric. Posterior circulation: Both V4 segments patent to the vertebrobasilar junction without stenosis. Both PICA origins patent and normal. Basilar widely patent to its distal aspect without stenosis. Superior cerebellar arteries patent bilaterally. Both PCAs primarily supplied via the basilar and are well perfused to there distal aspects. Venous sinuses: Patent allowing for timing the contrast bolus. Anatomic variants: None significant.  No aneurysm. Review of the MIP images confirms the above findings IMPRESSION: 1. Negative CTA of the head and neck. No large vessel occlusion, hemodynamically significant stenosis, or other acute vascular abnormality. 2. Small layering bilateral pleural effusions with associated pulmonary interstitial edema, suggesting CHF. These results were communicated to Dr. Derry Lory at 7:57 pm on 01/24/2021 by text page via the [facility] messaging system. Electronically Signed   By: Rise Mu M.D.    On: 01/24/2021 20:07     Princess Bruins, DO 01/25/2021, 12:13 AM PGY-1, [facility] FPTS Intern pager: [phone], text pages welcome   FPTS Upper-Level Resident Addendum   I have independently interviewed and examined the patient. I have discussed the above with the original author and agree with their documentation. My edits for correction/addition/clarification are in within the documentation. Please see also any attending notes.   Lavonda Jumbo, DO PGY-3, [facility] 01/25/2021 1:30 AM  FPTS Service pager: [phone] (text pages welcome through [facility])

## 2021-01-24 NOTE — Consult Note (Signed)
NEUROLOGY CONSULTATION NOTE   Date of service: January 24, 2021 Patient Name: William Henderson MRN:  [id number] DOB:  08/14/1965 Reason for consult: "Stroke code" Requesting Provider: Isla Pence, MD _ _ _   _ __   _ __ _ _  __ __   _ __   __ _  History of Present Illness  William Henderson is a 55 y.o. male with PMH significant for cardiac arrest s/p ICD, CHF, HTN, CKD 3, pAfibb on eliquis(but only takes it once a day and last dose was on 01/23/21 around 1900 who presents with R sided weakness.  Went to sit on the couch at 1750 and when attempting to get up, was weak on the right and fell.  Was unable to afford eliquis and just recently resumed last Friday.  mRS: 0 tNKase: Not offered, 2/2 patient on Eliquis. Thrombectomy: NIHSS components Score: Comment  1a Level of Conscious 0[x]  1[]  2[]  3[]      1b LOC Questions 0[x]  1[]  2[]       1c LOC Commands 0[x]  1[]  2[]       2 Best Gaze 0[x]  1[]  2[]       3 Visual 0[x]  1[]  2[]  3[]      4 Facial Palsy 0[x]  1[]  2[]  3[]      5a Motor Arm - left 0[x]  1[]  2[]  3[]  4[]  UN[]    5b Motor Arm - Right 0[x]  1[]  2[]  3[]  4[]  UN[]    6a Motor Leg - Left 0[x]  1[]  2[]  3[]  4[]  UN[]    6b Motor Leg - Right 0[]  1[x]  2[]  3[]  4[]  UN[]    7 Limb Ataxia 0[x]  1[]  2[]  3[]  UN[]     8 Sensory 0[x]  1[]  2[]  UN[]      9 Best Language 0[x]  1[]  2[]  3[]      10 Dysarthria 0[x]  1[]  2[]  UN[]      11 Extinct. and Inattention 0[x]  1[]  2[]       TOTAL: 1     ROS   Constitutional Denies weight loss, fever and chills.   HEENT Denies changes in vision and hearing.   Respiratory Denies SOB and cough.   CV Denies palpitations and CP   GI Denies abdominal pain, nausea, vomiting and diarrhea.   GU Denies dysuria and urinary frequency.   MSK Denies myalgia and joint pain.   Skin Denies rash and pruritus.   Neurological Denies headache and syncope.   Psychiatric Denies recent changes in mood. Denies anxiety and depression.    Past History   Past Medical History:  Diagnosis Date    Cardiac arrest ([place])    CHF (congestive heart failure) (HCC)    Hypertension    Nonischemic cardiomyopathy (HCC)    Paroxysmal atrial fibrillation (HCC)    Renal disorder    Past Surgical History:  Procedure Laterality Date   CARDIAC DEFIBRILLATOR PLACEMENT     No family history on file. Social History   Socioeconomic History   Marital status: Single    Spouse name: Not on file   Number of children: Not on file   Years of education: Not on file   Highest education level: Not on file  Occupational History   Not on file  Tobacco Use   Smoking status: Never   Smokeless tobacco: Never  Vaping Use   Vaping Use: Never used  Substance and Sexual Activity   Alcohol use: Never   Drug use: Never   Sexual activity: Not on file  Other Topics Concern   Not on file  Social History Narrative   Not  on file   Social Determinants of Health   Financial Resource Strain: Not on file  Food Insecurity: Not on file  Transportation Needs: Not on file  Physical Activity: Not on file  Stress: Not on file  Social Connections: Not on file   Allergies  Allergen Reactions   Other     Patient states he is allergic to an antibiotic but does not know the name of it.    Medications  (Not in a hospital admission)    Vitals   There were no vitals filed for this visit.   There is no height or weight on file to calculate BMI.  Physical Exam   General: Laying comfortably in bed; in no acute distress.  HENT: Normal oropharynx and mucosa. Normal external appearance of ears and nose.  Neck: Supple, no pain or tenderness  CV: No JVD. No peripheral edema.  Pulmonary: Symmetric Chest rise. Normal respiratory effort.  Abdomen: Soft to touch, non-tender.  Ext: No cyanosis, edema, or deformity  Skin: No rash. Normal palpation of skin.   Musculoskeletal: Normal digits and nails by inspection. No clubbing.   Neurologic Examination  Mental status/Cognition: Alert, oriented to self, place, month  and year, good attention.  Speech/language: Fluent, comprehension intact, object naming intact, repetition intact.  Cranial nerves:   CN II Pupils equal and reactive to light, no VF deficits    CN III,IV,VI EOM intact, no gaze preference or deviation, no nystagmus    CN V normal sensation in V1, V2, and V3 segments bilaterally    CN VII no asymmetry, no nasolabial fold flattening    CN VIII normal hearing to speech    CN IX & X normal palatal elevation, no uvular deviation    CN XI 5/5 head turn and 5/5 shoulder shrug bilaterally    CN XII midline tongue protrusion    Motor:  Muscle bulk: normal, tone normal, pronator drift none tremor none Mvmt Root Nerve  Muscle Right Left Comments  SA C5/6 Ax Deltoid     EF C5/6 Mc Biceps     EE C6/7/8 Rad Triceps     WF C6/7 Med FCR     WE C7/8 PIN ECU     F Ab C8/T1 U ADM/FDI 5 5   HF L1/2/3 Fem Illopsoas     KE L2/3/4 Fem Quad     DF L4/5 D Peron Tib Ant     PF S1/2 Tibial Grc/Sol      Reflexes:  Right Left Comments  Pectoralis      Biceps (C5/6)     Brachioradialis (C5/6)      Triceps (C6/7)      Patellar (L3/4)      Achilles (S1)      Hoffman      Plantar     Jaw jerk    Sensation:  Light touch    Pin prick    Temperature    Vibration   Proprioception    Coordination/Complex Motor:  - Finger to Nose intact on the left. - Heel to shin intact BL - Rapid alternating movement are normal - Gait: unsafe to assess.  Labs   CBC: No results for input(s): WBC, NEUTROABS, HGB, HCT, MCV, PLT in the last 168 hours.  Basic Metabolic Panel:  Lab Results  Component Value Date   NA 140 11/22/2020   K 3.5 11/22/2020   CO2 29 11/22/2020   GLUCOSE 128 (H) 11/22/2020   BUN 17 11/22/2020   CREATININE 1.66 (  H) 11/22/2020   CALCIUM 9.2 11/22/2020   GFRNONAA 48 (L) 11/22/2020   Lipid Panel: No results found for: LDLCALC HgbA1c: No results found for: HGBA1C Urine Drug Screen: No results found for: LABOPIA, COCAINSCRNUR, LABBENZ,  AMPHETMU, THCU, LABBARB  Alcohol Level No results found for: ETH  CT Head without contrast(Personally reviewed): CTH was negative for a large hypodensity concerning for a large territory infarct or hyperdensity concerning for an ICH  CT angio Head and Neck with contrast(Personally reviewed): No LVO  MRI Brain(Personally reviewed): pending Impression   William Henderson is a 55 y.o. male with PMH significant for cardiac arrest s/p ICD, CHF, HTN, CKD 3, pAfibb on eliquis(but only takes it once a day and last dose was on 01/23/21 around 1900 who presents with sudden onset R sided weakness with no cortical signs. High suspicion for stroke/TIA. Not a candidate for tNKAse due to taking Eliquis within the last 48 hours. Not a candidate for thrombectomy 2/2 no LVO.  Possibly embolic stroke/TIA given known pAfibb hx and not taking Eliquis as prescribed.  Primary Diagnosis:  Cerebral infarction due to embolism of  left middle cerebral artery.   Secondary Diagnosis: Paroxysmal atrial fibrillation  Recommendations  Plan:   - Frequent Neuro checks per stroke unit protocol - Recommend brain imaging with MRI Brain without contrast - Recommend obtaining TTE - Recommend obtaining Lipid panel with LDL - Please start statin if LDL > 70 - Recommend HbA1c - Antithrombotic - Aspirin 81mg  daily for now. Will discuss date to resume Eliquis based on the size of stroke on MRI Brain. - Recommend DVT ppx - SBP goal - permissive hypertension first 24 h < 220/110. Held home meds.  - Recommend Telemetry monitoring for arrythmia - Recommend bedside swallow screen prior to PO intake. - Stroke education booklet - Recommend PT/OT/SLP consult - Recommend Urine Tox screen.  _____________________________________________________________________  Plan discussed with Dr.   Thank you for the opportunity to take part in the care of this patient. If you have any further questions, please contact the neurology  consultation attending.  Signed,  Particia Nearing [facility] Pager Number [phone] _ _ _   _ __   _ __ _ _  __ __   _ __   __ _

## 2021-01-24 NOTE — ED Provider Notes (Addendum)
[facility] EMERGENCY DEPARTMENT Provider Note   CSN: [id number] Arrival date & time: 01/24/21  1928  An emergency department physician performed an initial assessment on this suspected stroke patient at 68.  History No chief complaint on file.   William Henderson is a 55 y.o. male.  Pt presents to the ED today with right sided weakness.  Pt woke up around 1750 and walked across the room and was normal.  Around 6 pm he could not move his right side.  Pt has a hx of afib and has not taken it in several months (due to cost) until starting it again on Friday, 12/16.  He was also taking it only once a day.  Pt did take it this am.  Pt is hypertensive and has not taken his bp meds today as he usually takes them at night.      Past Medical History:  Diagnosis Date   Cardiac arrest [facility])    CHF (congestive heart failure) (HCC)    Hypertension    Nonischemic cardiomyopathy (HCC)    Paroxysmal atrial fibrillation (HCC)    Renal disorder   Past Medical History Past Medical History:  Diagnosis Date   CHF (congestive heart failure) (HCC)   Chronic systolic CHF (congestive heart failure) ([place]) 02/17/2020   CRI (chronic renal insufficiency)   History of cardiac arrest 02/17/2020   History of CVA (cerebrovascular accident) 02/17/2020  Chronic lacunar infarct left thalamic nucleus with some hemosiderin deposition   Hx of echocardiogram 02/17/2020  12/13/2015 showed LV size normal, severe concentric LVH, impaired LV systolic function with LVEF 45-50%, Posterior and lateral walls are hypokinetic, Pseudonormal LV filling pattern, consistent with elevated left atrial filling pressures, LA mildly dilated, mild MR.   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 Taking?  EPINEPHrine (EPIPEN) 0.3 mg/0.3 mL Auto-Injector No No  Sig: Inject 0.3 mL (0.3 mg) by intramuscular injection one time only for 1 dose.  amLODIPine (NORVASC) 10 mg tablet Patient Yes No  Sig: Take 10 mg by mouth daily.  carvedilol (COREG) 25 mg tablet Patient Yes No  Sig: Take 25 mg by mouth 2 times daily.  cyclobenzaprine (FLEXERIL) 10 mg tablet No No  Sig: Take 1 Tab (10 mg) by mouth Three times daily as needed for Pain or Spasm.  furosemide (LASIX) 40 mg tablet Patient Yes No  Sig: Take 40 mg by mouth daily.  lisinopril (PRINIVIL) 10 mg tablet Patient Yes No  Sig: Take 10 mg by mouth daily.  oxyCODONE (ROXICODONE) 5 mg tablet No No  Sig: Take 1 Tab (5 mg) by mouth every 4 hours as needed (Pain scale 1-3). Max Daily Amount: 30 mg   Facility-Administered Medications: None  Amiodarone 200 mg p.o. twice daily  Family History family history includes Heart Failure in his father and mother; Hypertension in his father; Kidney Disease in his father; Stroke in his brother.   Social History reports that he has never smoked. He has never used smokeless tobacco. He reports that he does not drink alcohol and  does not use drugs.      Past Surgical History:  Procedure Laterality Date   CARDIAC DEFIBRILLATOR PLACEMENT         No family history on file.  Social History   Tobacco Use   Smoking status: Never   Smokeless tobacco: Never  Vaping Use   Vaping Use: Never used  Substance Use Topics   Alcohol use: Never   Drug use: Never    Home Medications Prior to Admission medications   Medication Sig Start Date End Date Taking? Authorizing Provider  amLODipine (NORVASC) 10 MG tablet Take 10 mg by  mouth at bedtime. 01/22/21  Yes [provider]  apixaban (ELIQUIS) 2.5 MG TABS tablet Take 1 tablet (2.5 mg total) by mouth 2 (two) times daily. 11/22/20 02/05/21 Yes Truddie Hidden, MD  carvedilol (COREG) 25 MG tablet Take 25 mg by mouth at bedtime. 08/18/20  Yes [provider]  furosemide (LASIX) 40 MG tablet Take 40 mg by mouth at bedtime. 12/16/20  Yes [provider]  lisinopril (ZESTRIL) 10 MG tablet Take 10 mg by mouth at bedtime. 01/15/21  Yes [provider]  PACERONE 200 MG tablet Take 200 mg by mouth at bedtime. 01/17/21  Yes [provider]  rosuvastatin (CRESTOR) 20 MG tablet Take 20 mg by mouth at bedtime. 12/21/20  Yes [provider]    Allergies    Cephalexin  Review of Systems   Review of Systems  Neurological:  Positive for weakness.  All other systems reviewed and are negative.  Physical Exam Updated Vital Signs BP (!) 123/99 (BP Location: Right Arm)    Pulse 99    Temp 98.9 F (37.2 C) (Oral)    Resp 20    SpO2 100%   Physical Exam Vitals and nursing note reviewed.  Constitutional:      Appearance: Normal appearance.  HENT:     Head: Normocephalic and atraumatic.     Right Ear: External ear normal.     Left Ear: External ear normal.     Nose: Nose normal.     Mouth/Throat:     Mouth: Mucous membranes are moist.     Pharynx: Oropharynx is clear.  Eyes:     Extraocular Movements: Extraocular movements intact.     Conjunctiva/sclera: Conjunctivae normal.     Pupils: Pupils are equal, round, and reactive to light.  Cardiovascular:     Rate and Rhythm: Tachycardia present. Rhythm irregular.     Pulses: Normal pulses.     Heart sounds: Normal heart sounds.  Pulmonary:     Effort: Pulmonary effort is normal.     Breath sounds: Rhonchi present.  Abdominal:     General: Abdomen is flat. Bowel sounds are normal.     Palpations: Abdomen is soft.  Musculoskeletal:        General: Normal range of motion.      Cervical back: Normal range of motion and neck supple.  Skin:    General: Skin is warm.     Capillary Refill: Capillary refill takes less than 2 seconds.  Neurological:     Mental Status: He is alert and oriented to person, place, and time.     Comments: Mild right sided weakness.  Improving.  Psychiatric:        Mood and Affect: Mood normal.        Behavior: Behavior normal.    ED Results / Procedures / Treatments   Labs (all labs ordered are listed, but  only abnormal results are displayed) Labs Reviewed  PROTIME-INR - Abnormal; Notable for the following components:      Result Value   Prothrombin Time 15.8 (*)    INR 1.3 (*)    All other components within normal limits  COMPREHENSIVE METABOLIC PANEL - Abnormal; Notable for the following components:   Glucose, Bld 117 (*)    Creatinine, Ser 1.98 (*)    Total Bilirubin 1.4 (*)    GFR, Estimated 39 (*)    All other components within normal limits  I-STAT CHEM 8, ED - Abnormal; Notable for the following components:   Potassium 3.4 (*)    BUN 21 (*)    Creatinine, Ser 1.90 (*)    Glucose, Bld 115 (*)    Calcium, Ion 1.00 (*)    All other components within normal limits  CBG MONITORING, ED - Abnormal; Notable for the following components:   Glucose-Capillary 123 (*)    All other components within normal limits  RESP PANEL BY RT-PCR (FLU A&B, COVID) ARPGX2  APTT  CBC  DIFFERENTIAL    EKG EKG Interpretation  Date/Time:  Tuesday January 24 2021 19:57:08 EST Ventricular Rate:  110 PR Interval:    QRS Duration: 106 QT Interval:  361 QTC Calculation: 489 R Axis:   265 Text Interpretation: Atrial fibrillation LAD, consider LAFB or inferior infarct Abnormal lateral Q waves Anterior infarct, old ST elevation, consider inferior injury Since last tracing rate faster Confirmed by Isla Pence [phone]) on 01/24/2021 8:50:02 PM  Radiology DG Chest Portable 1 View  Result Date: 01/24/2021 CLINICAL DATA:  Shortness of  breath EXAM: PORTABLE CHEST 1 VIEW COMPARISON:  11/22/2020 FINDINGS: Cardiac shadow is enlarged in size. Defibrillator is again noted and stable. The lungs are well aerated bilaterally. No focal infiltrate or effusion is seen. No bony abnormality is noted. IMPRESSION: No acute abnormality noted. Electronically Signed   By: Inez Catalina M.D.   On: 01/24/2021 20:49   CT HEAD CODE STROKE WO CONTRAST  Result Date: 01/24/2021 CLINICAL DATA:  Code stroke. Initial evaluation for acute right-sided weakness. EXAM: CT HEAD WITHOUT CONTRAST TECHNIQUE: Contiguous axial images were obtained from the base of the skull through the vertex without intravenous contrast. COMPARISON:  None available. FINDINGS: Brain: Cerebral volume within normal limits for age. Extensive patchy and confluent hypodensity involving the supratentorial white matter, most likely related chronic microvascular ischemic disease, advanced for age. Superimposed remote lacunar infarct at the posterior right frontal corona radiata. Small remote left cerebellar infarct noted. No acute intracranial hemorrhage. No acute large vessel territory infarct. No mass lesion, midline shift or mass effect. No hydrocephalus or are extra-axial fluid collection. Vascular: No hyperdense vessel. Skull: Scalp soft tissues and calvarium within normal limits. Sinuses/Orbits: Globes orbital soft tissues within normal limits. Chronic left maxillary sinusitis noted. Paranasal sinuses are otherwise clear. No mastoid effusion. Other: None. ASPECTS [facility] Stroke Program Early CT Score) - Ganglionic level infarction (caudate, lentiform nuclei, internal capsule, insula, M1-M3 cortex): 7 - Supraganglionic infarction (M4-M6 cortex): 3 Total score (0-10 with 10 being normal): 10 IMPRESSION: 1. No acute intracranial abnormality. 2. ASPECTS is 10. 3. Advanced chronic microvascular ischemic disease for age, with small remote infarcts involving the posterior right frontal corona radiata and  left cerebellum. These results were communicated to Dr. Lorrin Goodell at 7:44 pm on 01/24/2021 by text page via the [facility] messaging system. Electronically Signed   By: Jeannine Boga M.D.   On: 01/24/2021 19:47   CT ANGIO HEAD NECK W  WO CM (CODE STROKE)  Result Date: 01/24/2021 CLINICAL DATA:  Initial evaluation for neuro deficit, stroke suspected. EXAM: CT ANGIOGRAPHY HEAD AND NECK TECHNIQUE: Multidetector CT imaging of the head and neck was performed using the standard protocol during bolus administration of intravenous contrast. Multiplanar CT image reconstructions and MIPs were obtained to evaluate the vascular anatomy. Carotid stenosis measurements (when applicable) are obtained utilizing NASCET criteria, using the distal internal carotid diameter as the denominator. CONTRAST:  49mL OMNIPAQUE IOHEXOL 350 MG/ML SOLN COMPARISON:  Prior head CT from earlier the same day. FINDINGS: CTA NECK FINDINGS Aortic arch: Visualized aortic arch normal caliber with normal branch pattern. No stenosis about the origin of the great vessels. Right carotid system: Right common and internal carotid arteries widely patent without stenosis, dissection or occlusion. Left carotid system: Left common and internal carotid arteries widely patent without stenosis, dissection or occlusion. Vertebral arteries: Left vertebral artery arises directly from the aortic arch. Right vertebral artery slightly dominant. Vertebral arteries patent without stenosis, dissection or occlusion. Skeleton: No discrete or worrisome osseous lesions. Mild-to-moderate spondylosis present at C4-5 through C6-7. Other neck: No other acute soft tissue abnormality within the neck. Small intramuscular lipoma noted at the left posterolateral neck. No other mass or adenopathy. Upper chest: Left-sided pacemaker/AICD in place. Small layering bilateral pleural effusions. Scattered ground-glass opacity with interlobular septal thickening within the visualized lungs  consistent with pulmonary interstitial edema. Review of the MIP images confirms the above findings CTA HEAD FINDINGS Anterior circulation: Both internal carotid arteries widely patent to the termini without stenosis. A1 segments widely patent. Normal anterior communicating artery complex. Both anterior cerebral arteries widely patent to their distal aspects without stenosis. No M1 stenosis or occlusion. Normal MCA bifurcations. Distal MCA branches well perfused and symmetric. Posterior circulation: Both V4 segments patent to the vertebrobasilar junction without stenosis. Both PICA origins patent and normal. Basilar widely patent to its distal aspect without stenosis. Superior cerebellar arteries patent bilaterally. Both PCAs primarily supplied via the basilar and are well perfused to there distal aspects. Venous sinuses: Patent allowing for timing the contrast bolus. Anatomic variants: None significant.  No aneurysm. Review of the MIP images confirms the above findings IMPRESSION: 1. Negative CTA of the head and neck. No large vessel occlusion, hemodynamically significant stenosis, or other acute vascular abnormality. 2. Small layering bilateral pleural effusions with associated pulmonary interstitial edema, suggesting CHF. These results were communicated to Dr. Derry Lory at 7:57 pm on 01/24/2021 by text page via the [facility] messaging system. Electronically Signed   By: Rise Mu M.D.   On: 01/24/2021 20:07    Procedures Procedures   Medications Ordered in ED Medications  sodium chloride flush (NS) 0.9 % injection 3 mL (3 mLs Intravenous Given 01/24/21 2002)  iohexol (OMNIPAQUE) 350 MG/ML injection 75 mL (75 mLs Intravenous Contrast Given 01/24/21 1945)  furosemide (LASIX) injection 40 mg (40 mg Intravenous Given 01/24/21 2103)  labetalol (NORMODYNE) injection 10 mg (10 mg Intravenous Given 01/24/21 2102)    ED Course  I have reviewed the triage vital signs and the nursing notes.  Pertinent  labs & imaging results that were available during my care of the patient were reviewed by me and considered in my medical decision making (see chart for details).    MDM Rules/Calculators/A&P                         Code stroke called by EMS.  Dr. Derry Lory (neuro) recommended admission to medicine  for stroke work up.  Pt is not a candidate for tnk or thrombectomy as sx are rapidly improving and he is on Eliquis.  Pt has afib and elevated bp more than permissive htn.  He is given labetalol to help with both.  Pt has a [employer] AICD in place.  I had it interrogated.  He had a run of VF on 12/1 and was shocked around 1100 (pt unaware he was shocked).  He had a long run of VT on 12/13.  He has been in afib since 12/3.  His home monitor is not connecting with his device.  Device is also showing fluid overload.  Pt d/w FP for admission.  CRITICAL CARE Performed by: Isla Pence   Total critical care time: 30 minutes  Critical care time was exclusive of separately billable procedures and treating other patients.  Critical care was necessary to treat or prevent imminent or life-threatening deterioration.  Critical care was time spent personally by me on the following activities: development of treatment plan with patient and/or surrogate as well as nursing, discussions with consultants, evaluation of patient's response to treatment, examination of patient, obtaining history from patient or surrogate, ordering and performing treatments and interventions, ordering and review of laboratory studies, ordering and review of radiographic studies, pulse oximetry and re-evaluation of patient's condition.  Final Clinical Impression(s) / ED Diagnoses Final diagnoses:  Cerebrovascular accident (CVA), unspecified mechanism ([place])  Atrial fibrillation with RVR ([place])  On apixaban therapy  Acute on chronic congestive heart failure, unspecified heart failure type [facility])    Rx / DC Orders ED Discharge  Orders     None        Isla Pence, MD 01/24/21 2147    Isla Pence, MD 01/24/21 2259

## 2021-01-24 NOTE — Hospital Course (Addendum)
Virat Prather is a 55 y.o. 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 fib with inadequate anticoagulation, hypertension, CKD 3B who presented with transient right-sided weakness.  Plan by Problem:  TIA vs CVA Patient [facility]/20 with acute onset of right-sided weakness.  Upon arrival to the ED, his symptoms had resolved.  Stroke work-up with CTA head, CTA head/neck did not show any acute intracranial abnormality but did show small remote infarcts in the posterior right frontal corona radiata and left cerebellum consistent with his known CVA in 2017.  Echocardiogram was obtained and showed advanced heart failure consistent with his prior studies.  There is no evidence of thrombus or shunt.  MRI brain was unable to be obtained because the patient was unable to lay flat on the table.  He was able to tolerate a repeat CT head without contrast as long as the head of the table was mildly elevated.  He was evaluated by OT, PT with no recommendations for follow-up. Our neurology team was activated on arrival and followed throughout his hospitalization.  They felt that the most likely cause of his TIA was an embolism secondary to his atrial fibrillation with inadequate anticoagulation as discussed below.  He was discharged in stable condition on 12/22 on Eliquis 5 mg twice daily per neurology's recommendations.  Persistent atrial fibrillation with an adequate anticoagulation Patient has a longstanding known history of A. fib.  He was previously prescribed Eliquis but had not been taking it due to inaffordability.  He reports that on 12/17 he started taking 1 2.5 mg tablet daily.  We determined that patient did not meet any criteria for dose reduction of his Eliquis and would be more appropriately treated with 5 mg twice daily.  He was discharged on this appropriate anticoagulation regimen.  Other chronic conditions were medically  managed with home medications and formulary alternatives as necessary   Issues for Follow Up:  Patient is being discharged on Eliquis 5 mg twice daily for anticoagulation.  Recommend periodic medication audits to ensure adherence in order to prevent future events. We increased the patient's Coreg from 25 mg daily to 25 mg twice daily.  Please follow-up to monitor for tolerability. We transitioned this patient from Crestor 20 mg daily to atorvastatin 80 mg daily due to his borderline GFR.  Please continue to monitor lipid levels and kidney function. Patient's amlodipine and Norvasc were held on admission.  On discharge we reinitiated Norvasc but held lisinopril.  Consider restarting lisinopril at hospital follow-up after repeat BMP and blood pressure assessment.

## 2021-01-25 ENCOUNTER — Emergency Department (HOSPITAL_BASED_OUTPATIENT_CLINIC_OR_DEPARTMENT_OTHER): Payer: Medicare Other

## 2021-01-25 ENCOUNTER — Other Ambulatory Visit (HOSPITAL_COMMUNITY): Payer: Self-pay

## 2021-01-25 ENCOUNTER — Encounter (HOSPITAL_COMMUNITY): Payer: Self-pay | Admitting: Student

## 2021-01-25 ENCOUNTER — Other Ambulatory Visit: Payer: Self-pay

## 2021-01-25 DIAGNOSIS — G459 Transient cerebral ischemic attack, unspecified: Secondary | ICD-10-CM

## 2021-01-25 DIAGNOSIS — E785 Hyperlipidemia, unspecified: Secondary | ICD-10-CM | POA: Diagnosis not present

## 2021-01-25 DIAGNOSIS — I480 Paroxysmal atrial fibrillation: Secondary | ICD-10-CM | POA: Diagnosis present

## 2021-01-25 DIAGNOSIS — I4891 Unspecified atrial fibrillation: Secondary | ICD-10-CM | POA: Diagnosis not present

## 2021-01-25 DIAGNOSIS — I10 Essential (primary) hypertension: Secondary | ICD-10-CM | POA: Diagnosis not present

## 2021-01-25 DIAGNOSIS — I639 Cerebral infarction, unspecified: Secondary | ICD-10-CM | POA: Diagnosis not present

## 2021-01-25 DIAGNOSIS — N179 Acute kidney failure, unspecified: Secondary | ICD-10-CM | POA: Diagnosis present

## 2021-01-25 LAB — CBC
HCT: 45.3 % (ref 39.0–52.0)
Hemoglobin: 15 g/dL (ref 13.0–17.0)
MCH: 31.2 pg (ref 26.0–34.0)
MCHC: 33.1 g/dL (ref 30.0–36.0)
MCV: 94.2 fL (ref 80.0–100.0)
Platelets: 197 10*3/uL (ref 150–400)
RBC: 4.81 MIL/uL (ref 4.22–5.81)
RDW: 15.1 % (ref 11.5–15.5)
WBC: 9.2 10*3/uL (ref 4.0–10.5)
nRBC: 0.2 % (ref 0.0–0.2)

## 2021-01-25 LAB — BASIC METABOLIC PANEL
Anion gap: 10 (ref 5–15)
BUN: 19 mg/dL (ref 6–20)
CO2: 23 mmol/L (ref 22–32)
Calcium: 8.7 mg/dL — ABNORMAL LOW (ref 8.9–10.3)
Chloride: 103 mmol/L (ref 98–111)
Creatinine, Ser: 2 mg/dL — ABNORMAL HIGH (ref 0.61–1.24)
GFR, Estimated: 38 mL/min — ABNORMAL LOW (ref >60)
Glucose, Bld: 103 mg/dL — ABNORMAL HIGH (ref 70–99)
Potassium: 4 mmol/L (ref 3.5–5.1)
Sodium: 136 mmol/L (ref 135–145)

## 2021-01-25 LAB — HEMOGLOBIN A1C
Hgb A1c MFr Bld: 6.1 % — ABNORMAL HIGH (ref 4.8–5.6)
Mean Plasma Glucose: 128.37 mg/dL

## 2021-01-25 LAB — LIPID PANEL
Cholesterol: 94 mg/dL (ref 0–200)
HDL: 23 mg/dL — ABNORMAL LOW (ref >40)
LDL Cholesterol: 56 mg/dL (ref 0–99)
Total CHOL/HDL Ratio: 4.1 RATIO
Triglycerides: 73 mg/dL (ref <150)
VLDL: 15 mg/dL (ref 0–40)

## 2021-01-25 LAB — RAPID URINE DRUG SCREEN, HOSP PERFORMED
Amphetamines: NOT DETECTED
Barbiturates: NOT DETECTED
Benzodiazepines: NOT DETECTED
Cocaine: NOT DETECTED
Opiates: NOT DETECTED
Tetrahydrocannabinol: NOT DETECTED

## 2021-01-25 LAB — ECHOCARDIOGRAM COMPLETE
Calc EF: 22.6 %
S' Lateral: 5.6 cm
Single Plane A2C EF: 23.9 %
Single Plane A4C EF: 21.4 %
Weight: 4512 oz

## 2021-01-25 MED ORDER — ENOXAPARIN SODIUM 40 MG/0.4ML IJ SOSY
40.0000 mg | PREFILLED_SYRINGE | INTRAMUSCULAR | Status: DC
Start: 2021-01-25 — End: 2021-01-25
  Administered 2021-01-25: 10:00:00 40 mg via SUBCUTANEOUS
  Filled 2021-01-25: qty 0.4

## 2021-01-25 MED ORDER — APIXABAN 5 MG PO TABS
5.0000 mg | ORAL_TABLET | Freq: Two times a day (BID) | ORAL | Status: DC
  Administered 2021-01-25 – 2021-01-26 (×3): 5 mg via ORAL
  Filled 2021-01-25: qty 2
  Filled 2021-01-25 (×2): qty 1

## 2021-01-25 MED ORDER — ATORVASTATIN CALCIUM 80 MG PO TABS
80.0000 mg | ORAL_TABLET | Freq: Every day | ORAL | Status: DC
  Administered 2021-01-25 – 2021-01-26 (×2): 80 mg via ORAL
  Filled 2021-01-25 (×2): qty 1

## 2021-01-25 MED ORDER — ROSUVASTATIN CALCIUM 5 MG PO TABS: 10.0000 mg | ORAL_TABLET | Freq: Every day | ORAL | Status: DC

## 2021-01-25 MED ORDER — ASPIRIN EC 81 MG PO TBEC
81.0000 mg | DELAYED_RELEASE_TABLET | Freq: Every day | ORAL | Status: DC
  Administered 2021-01-25 – 2021-01-26 (×2): 81 mg via ORAL
  Filled 2021-01-25 (×2): qty 1

## 2021-01-25 MED ORDER — AMIODARONE HCL 200 MG PO TABS
200.0000 mg | ORAL_TABLET | Freq: Every day | ORAL | Status: DC
  Administered 2021-01-25 (×2): 200 mg via ORAL
  Filled 2021-01-25 (×2): qty 1

## 2021-01-25 MED ORDER — PERFLUTREN LIPID MICROSPHERE
1.0000 mL | INTRAVENOUS | Status: AC | PRN
  Administered 2021-01-25: 14:00:00 2 mL via INTRAVENOUS
  Filled 2021-01-25: qty 10

## 2021-01-25 NOTE — TOC Initial Note (Signed)
Transition of Care [facility]) - Initial/Assessment Note    Patient Details  Name: William Henderson MRN: [id number] Date of Birth: Feb 07, 1965  [facility]) CM/SW Contact:    Kermit Balo, RN Phone Number: 01/25/2021, 4:05 PM  Clinical Narrative:                 Patient is from home with 56 yo son. He denies any DME at home. Pt was having issues affording his eliquis but falls under his medicaid currently and co pay is $4.50.  Pt denies issues with transportation.  TOC following.  Expected Discharge Plan: Home/Self Care Barriers to Discharge: Continued Medical Work up   Patient Goals and CMS Choice        Expected Discharge Plan and Services Expected Discharge Plan: Home/Self Care   Discharge Planning Services: CM Consult   Living arrangements for the past 2 months: Single Family Home                                      Prior Living Arrangements/Services Living arrangements for the past 2 months: Single Family Home Lives with:: Minor Children Patient language and need for interpreter reviewed:: Yes Do you feel safe going back to the place where you live?: Yes            Criminal Activity/Legal Involvement Pertinent to Current Situation/Hospitalization: No - Comment as needed  Activities of Daily Living Home Assistive Devices/Equipment: None ADL Screening (condition at time of admission) Patient's cognitive ability adequate to safely complete daily activities?: Yes Is the patient deaf or have difficulty hearing?: No Does the patient have difficulty seeing, even when wearing glasses/contacts?: No Does the patient have difficulty concentrating, remembering, or making decisions?: No Patient able to express need for assistance with ADLs?: Yes Does the patient have difficulty dressing or bathing?: No Independently performs ADLs?: Yes (appropriate for developmental age) Does the patient have difficulty walking or climbing stairs?: No Weakness of Legs:  None Weakness of Arms/Hands: None  Permission Sought/Granted                  Emotional Assessment Appearance:: Appears stated age Attitude/Demeanor/Rapport: Engaged Affect (typically observed): Accepting Orientation: : Oriented to Self, Oriented to Place, Oriented to  Time, Oriented to Situation   Psych Involvement: No (comment)  Admission diagnosis:  TIA (transient ischemic attack) [G45.9] Atrial fibrillation with RVR (HCC) [I48.91] Cerebrovascular accident (CVA), unspecified mechanism (HCC) [I63.9] On apixaban therapy [Z79.01] Acute on chronic congestive heart failure, unspecified heart failure type [facility]) [I50.9] Patient Active Problem List   Diagnosis Date Noted   TIA (transient ischemic attack) 01/25/2021   AKI (acute kidney injury) (HCC) 01/25/2021   Paroxysmal atrial fibrillation (HCC) 01/25/2021   Hypertension 01/25/2021   Hyperlipidemia LDL goal <70 01/25/2021   PCP:  Elder Negus, PA-C Pharmacy:   [id number] - [place] ([place]), [place] - [address]) [place] [address] Phone: [phone] Fax: [phone]     Social Determinants of Health (SDOH) Interventions    Readmission Risk Interventions No flowsheet data found.

## 2021-01-25 NOTE — Care Management Obs Status (Signed)
MEDICARE OBSERVATION STATUS NOTIFICATION   Patient Details  Name: Ac Colan MRN: [id number] Date of Birth: [birth year]   Medicare Observation Status Notification Given:  Yes    Kermit Balo, RN 01/25/2021, 4:04 PM

## 2021-01-25 NOTE — Progress Notes (Signed)
Patient stated that he was having difficult time breathing and this happens at home at [facility]. Paced him on 2 liters [place] he stated he felt better and that he will be having a sleep study December 28 th Ilean Skill LPN

## 2021-01-25 NOTE — Progress Notes (Signed)
SLP Cancellation Note  Patient Details Name: William Henderson MRN: [id number] DOB: Mar 17, 1965   Cancelled treatment:       Reason Eval/Treat Not Completed: SLP screened, no needs identified, will sign off; Passed [facility] swallow screen; nursing/pt deny dysphagia; pt consuming portion of lunch tray with no apparent difficulty.  Please re-consult prn.   Tressie Stalker, M.S., CCC-SLP 01/25/2021, 1:38 PM

## 2021-01-25 NOTE — Progress Notes (Signed)
STROKE TEAM PROGRESS NOTE   INTERVAL HISTORY Patient is seen in ED resting comfortably. Explains that he was not previously taking eliquis because of cost. He now has insurance and he started taking 01/20/21 but only once a day. Counseled to take it appropriately, pt is agreeable.  MRI scan of the brain is pending.  CT angiogram shows no significant large vessel stenosis or occlusion.  LDL cholesterol is 56 mg percent and hemoglobin A1c 6.1.     Vitals:   01/25/21 0329 01/25/21 0650 01/25/21 0700 01/25/21 0900  BP: 131/90 (!) 137/107 (!) 126/100 (!) 141/98  Pulse: 91 92 81 82  Resp: 19 17 18  (!) 24  Temp:   98.7 F (37.1 C)   TempSrc:   Oral   SpO2: 95% 96% 97% 100%  Weight:       CBC:  Recent Labs  Lab 01/24/21 1930 01/24/21 1943 01/25/21 0240  WBC 9.0  --  9.2  NEUTROABS 5.9  --   --   HGB 15.8 17.0 15.0  HCT 49.1 50.0 45.3  MCV 97.2  --  94.2  PLT 181  --  197   Basic Metabolic Panel:  Recent Labs  Lab 01/24/21 1930 01/24/21 1943 01/25/21 0240  NA 138 140 136  K 3.6 3.4* 4.0  CL 105 106 103  CO2 22  --  23  GLUCOSE 117* 115* 103*  BUN 20 21* 19  CREATININE 1.98* 1.90* 2.00*  CALCIUM 9.1  --  8.7*   Lipid Panel:  Recent Labs  Lab 01/25/21 0006  CHOL 94  TRIG 73  HDL 23*  CHOLHDL 4.1  VLDL 15  LDLCALC 56   HgbA1c:  Recent Labs  Lab 01/25/21 0004  HGBA1C 6.1*   Urine Drug Screen:  Recent Labs  Lab 01/25/21 0031  LABOPIA NONE DETECTED  COCAINSCRNUR NONE DETECTED  LABBENZ NONE DETECTED  AMPHETMU NONE DETECTED  THCU NONE DETECTED  LABBARB NONE DETECTED    Alcohol Level No results for input(s): ETH in the last 168 hours.  IMAGING past 24 hours DG Chest Portable 1 View  Result Date: 01/24/2021 CLINICAL DATA:  Shortness of breath EXAM: PORTABLE CHEST 1 VIEW COMPARISON:  11/22/2020 FINDINGS: Cardiac shadow is enlarged in size. Defibrillator is again noted and stable. The lungs are well aerated bilaterally. No focal infiltrate or effusion is seen.  No bony abnormality is noted. IMPRESSION: No acute abnormality noted. Electronically Signed   By: 11/24/2020 M.D.   On: 01/24/2021 20:49   CT HEAD CODE STROKE WO CONTRAST  Result Date: 01/24/2021 CLINICAL DATA:  Code stroke. Initial evaluation for acute right-sided weakness. EXAM: CT HEAD WITHOUT CONTRAST TECHNIQUE: Contiguous axial images were obtained from the base of the skull through the vertex without intravenous contrast. COMPARISON:  None available. FINDINGS: Brain: Cerebral volume within normal limits for age. Extensive patchy and confluent hypodensity involving the supratentorial white matter, most likely related chronic microvascular ischemic disease, advanced for age. Superimposed remote lacunar infarct at the posterior right frontal corona radiata. Small remote left cerebellar infarct noted. No acute intracranial hemorrhage. No acute large vessel territory infarct. No mass lesion, midline shift or mass effect. No hydrocephalus or are extra-axial fluid collection. Vascular: No hyperdense vessel. Skull: Scalp soft tissues and calvarium within normal limits. Sinuses/Orbits: Globes orbital soft tissues within normal limits. Chronic left maxillary sinusitis noted. Paranasal sinuses are otherwise clear. No mastoid effusion. Other: None. ASPECTS [facility] Stroke Program Early CT Score) - Ganglionic level infarction (caudate, lentiform nuclei, internal  capsule, insula, M1-M3 cortex): 7 - Supraganglionic infarction (M4-M6 cortex): 3 Total score (0-10 with 10 being normal): 10 IMPRESSION: 1. No acute intracranial abnormality. 2. ASPECTS is 10. 3. Advanced chronic microvascular ischemic disease for age, with small remote infarcts involving the posterior right frontal corona radiata and left cerebellum. These results were communicated to Dr. Derry Lory at 7:44 pm on 01/24/2021 by text page via the [facility] messaging system. Electronically Signed   By: Rise Mu M.D.   On: 01/24/2021 19:47   CT ANGIO  HEAD NECK W WO CM (CODE STROKE)  Result Date: 01/24/2021 CLINICAL DATA:  Initial evaluation for neuro deficit, stroke suspected. EXAM: CT ANGIOGRAPHY HEAD AND NECK TECHNIQUE: Multidetector CT imaging of the head and neck was performed using the standard protocol during bolus administration of intravenous contrast. Multiplanar CT image reconstructions and MIPs were obtained to evaluate the vascular anatomy. Carotid stenosis measurements (when applicable) are obtained utilizing NASCET criteria, using the distal internal carotid diameter as the denominator. CONTRAST:  21mL OMNIPAQUE IOHEXOL 350 MG/ML SOLN COMPARISON:  Prior head CT from earlier the same day. FINDINGS: CTA NECK FINDINGS Aortic arch: Visualized aortic arch normal caliber with normal branch pattern. No stenosis about the origin of the great vessels. Right carotid system: Right common and internal carotid arteries widely patent without stenosis, dissection or occlusion. Left carotid system: Left common and internal carotid arteries widely patent without stenosis, dissection or occlusion. Vertebral arteries: Left vertebral artery arises directly from the aortic arch. Right vertebral artery slightly dominant. Vertebral arteries patent without stenosis, dissection or occlusion. Skeleton: No discrete or worrisome osseous lesions. Mild-to-moderate spondylosis present at C4-5 through C6-7. Other neck: No other acute soft tissue abnormality within the neck. Small intramuscular lipoma noted at the left posterolateral neck. No other mass or adenopathy. Upper chest: Left-sided pacemaker/AICD in place. Small layering bilateral pleural effusions. Scattered ground-glass opacity with interlobular septal thickening within the visualized lungs consistent with pulmonary interstitial edema. Review of the MIP images confirms the above findings CTA HEAD FINDINGS Anterior circulation: Both internal carotid arteries widely patent to the termini without stenosis. A1 segments  widely patent. Normal anterior communicating artery complex. Both anterior cerebral arteries widely patent to their distal aspects without stenosis. No M1 stenosis or occlusion. Normal MCA bifurcations. Distal MCA branches well perfused and symmetric. Posterior circulation: Both V4 segments patent to the vertebrobasilar junction without stenosis. Both PICA origins patent and normal. Basilar widely patent to its distal aspect without stenosis. Superior cerebellar arteries patent bilaterally. Both PCAs primarily supplied via the basilar and are well perfused to there distal aspects. Venous sinuses: Patent allowing for timing the contrast bolus. Anatomic variants: None significant.  No aneurysm. Review of the MIP images confirms the above findings IMPRESSION: 1. Negative CTA of the head and neck. No large vessel occlusion, hemodynamically significant stenosis, or other acute vascular abnormality. 2. Small layering bilateral pleural effusions with associated pulmonary interstitial edema, suggesting CHF. These results were communicated to Dr. Derry Lory at 7:57 pm on 01/24/2021 by text page via the [facility] messaging system. Electronically Signed   By: Rise Mu M.D.   On: 01/24/2021 20:07    PHYSICAL EXAM Pleasant middle-aged male not in distress. . Afebrile. Head is nontraumatic. Neck is supple without bruit.    Cardiac exam no murmur or gallop. Lungs are clear to auscultation. Distal pulses are well felt.  Neurological Exam ;  Awake  Alert oriented x 3. Normal speech and language.eye movements full without nystagmus.fundi were not visualized. Vision acuity  and fields appear normal. Hearing is normal. Palatal movements are normal. Face symmetric. Tongue midline. Normal strength, tone, reflexes and coordination. Normal sensation. Gait deferred.  ASSESSMENT/PLAN Mr. William Henderson is a 55 y.o. male with history of cardiac arrest s/p ICD, CHF, HTN, CKD 3, pAfibb on eliquis presenting with R sided weakness  and fall.   Stroke: Suspect left brain subcortical infarct likely secondary due to embolism  given AF not on full dose AC Code Stroke CT head No acute abnormality. ASPECTS 10.    CTA head & neck no LVO or stenoses. Small layering B pleural effusions MRI pending 2D Echo EF 20-25%. No source of embolus  LDL 56 HgbA1c 6.1 VTE prophylaxis - eliquis Eliquis but only taking one a day  prior to admission, now on full dose Eliquis (apixaban) daily. Continue at d/c.  Therapy recommendations:  no therapy needs Disposition:  return home   Atrial Fibrillation Home anticoagulation:  Eliquis once a day d/t insurance, now BID Continue Eliquis (apixaban) daily at discharge   Hypertension Stable Permissive hypertension (OK if < 220/120) but gradually normalize in 5-7 days Long-term BP goal normotensive  Hyperlipidemia Home meds:  crestor 20, resumed in hospital LDL 56, goal < 70  Continue statin at discharge  Other Stroke Risk Factors Morbid Obesity, Body mass index is 40.46 kg/m., BMI >/= 30 associated with increased stroke risk, recommend weight loss, diet and exercise as appropriate  Likely Obstructive sleep apnea, Supposed to have a sleep study- 12/28 appt with primary Congestive heart failure NICM S/p ICD  Other Active Problems [facility] day # 0  I have personally obtained history,examined this patient, reviewed notes, independently viewed imaging studies, participated in medical decision making and plan of care.ROS completed by me personally and pertinent positives fully documented  I have made any additions or clarifications directly to the above note. Agree with note above.  Patient presented with transient right-sided weakness likely due to small left subcortical infarct etiology likely embolism from atrial fibrillation on suboptimal anticoagulation.  Patient counseled to take Eliquis twice daily which is the recommended dose and maintain aggressive risk factor modification.   Check MRI scan.  Greater than 50% time during this 35-minute visit was spent in counseling and coordination of care and discussion with care team and answering questions.  Antony Contras, MD Medical Director [facility] Pager: [phone] 01/25/2021 9:34 PM   To contact Stroke Continuity provider, please refer to [email or web address] After hours, contact General Neurology

## 2021-01-25 NOTE — Progress Notes (Addendum)
Family Medicine Teaching Service Daily Progress Note Intern Pager: [phone]  Patient name: William Henderson Medical record number: [id number] Date of birth: Sep 07, 1965 Age: 55 y.o. Gender: male  Primary Care Provider: Candi Leash, PA-C Consultants: Neurology  Code Status: Full   Pt Overview and Major Events to Date:  12/20- Admitted  Assessment and Plan: William Henderson is a 55yo male who presented with transient right-sided weakness. He has a history of cardiac arrest s/p ICD placement (2019), CVA (2017), HFmrEF, NICM, persistent A Fib with inadequate anticoagulation, HTN, CKD 3b.    Transient R-Sided Weakness   TIA vs CVA Strength is back to baseline. MRI brain pending--unable to obtain overnight due to need to place ICD into safe mode. Risk stratification labs significant for A1c 6.1%, LDL 56, total cholesterol 94.  - f/u MRI - Will need TTE  - ASA 56m daily, likely will need DAPT pending neuro note - PT/OT - Holding antihypertensives for permissive HTN - Neuro following, appreciate recs  Persistent A Fib, inadequate anticoagulation Patient was only prescribed Eliquis 2.515mBID, despite not meeting criteria for dose reduction. He also had not taken it prior to 12/16, and even then was only taking it once daily.  Pharmacy assisting in looking into co-pay as patient's reason for nonadherence was cost of medication. - Continue home Amiodarone 20016maily - Restart Eliquis at 5mg11mD  CKD 3B Cr 1.9>2.0 which seems to be his baseline per review of Care Everywhere. eGFR 38.   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 Slight crackles on lung exam today but no LE edema or JVP, will hold off on re-dosing for now. - Will re-dose Lasix if becomes volume overloaded  - Hold off on restarting home Coreg for permissive HTN  HTN Home meds include lisinopril 10mg 74my and amlodipine  10mg d35m - Holding home meds for permissive HTN  HLD LDL at goal. HDL low at 23.  - Will transition from Crestor to Atorvastatin 80mg da37m    FEN/GI: Heart healthy PPx: On Eliquis Dispo:Pending PT recommendations     Subjective:  [name] haCarandangcomplaints this morning.  He says that he feels back to his baseline and feels that his strength is returned.  He has been able to get up and walk without issue as well.  He says that the reason he did not take his Eliquis was due to the cost.  Objective: Temp:  [98.7 F (37.1 C)-98.9 F (37.2 C)] 98.7 F (37.1 C) (12/21 0700) Pulse Rate:  [54-110] 89 (12/21 1030) Resp:  [16-32] 27 (12/21 1030) BP: (121-157)/(75-121) 121/89 (12/21 1030) SpO2:  [90 %-100 %] 92 % (12/21 1030) Weight:  [127.9 kg] 127.9 kg (12/21 0128) Physical Exam: General: Alert, well nourished 55 year 45d male Cardiovascular: Irregularly irregular, normal rate, no murmur, pulses 2+ throughout, no JVP Respiratory: Bibasilar crackles, intermittently tachypneic Abdomen: Soft, nontender, nondistended Extremities: No LE edema  Laboratory: Recent Labs  Lab 01/24/21 1930 01/24/21 1943 01/25/21 0240  WBC 9.0  --  9.2  HGB 15.8 17.0 15.0  HCT 49.1 50.0 45.3  PLT 181  --  197   Recent Labs  Lab 01/24/21 1930 01/24/21 1943 01/25/21 0240  NA 138 140 136  K 3.6 3.4* 4.0  CL 105 106 103  CO2 22  --  23  BUN 20 21* 19  CREATININE 1.98* 1.90*  2.00*  CALCIUM 9.1  --  8.7*  PROT 7.8  --   --   BILITOT 1.4*  --   --   ALKPHOS 46  --   --   ALT 17  --   --   AST 22  --   --   GLUCOSE 117* 115* 103*     Imaging/Diagnostic Tests: DG Chest Portable 1 View CLINICAL DATA:  Shortness of breath  EXAM: PORTABLE CHEST 1 VIEW  COMPARISON:  11/22/2020  FINDINGS: Cardiac shadow is enlarged in size. Defibrillator is again noted and stable. The lungs are well aerated bilaterally. No focal infiltrate or effusion is seen. No bony abnormality is  noted.  IMPRESSION: No acute abnormality noted.  Electronically Signed   By: Inez Catalina M.D.   On: 01/24/2021 20:49 CT ANGIO HEAD NECK W WO CM (CODE STROKE) CLINICAL DATA:  Initial evaluation for neuro deficit, stroke suspected.  EXAM: CT ANGIOGRAPHY HEAD AND NECK  TECHNIQUE: Multidetector CT imaging of the head and neck was performed using the standard protocol during bolus administration of intravenous contrast. Multiplanar CT image reconstructions and MIPs were obtained to evaluate the vascular anatomy. Carotid stenosis measurements (when applicable) are obtained utilizing NASCET criteria, using the distal internal carotid diameter as the denominator.  CONTRAST:  23m OMNIPAQUE IOHEXOL 350 MG/ML SOLN  COMPARISON:  Prior head CT from earlier the same day.  FINDINGS: CTA NECK FINDINGS  Aortic arch: Visualized aortic arch normal caliber with normal branch pattern. No stenosis about the origin of the great vessels.  Right carotid system: Right common and internal carotid arteries widely patent without stenosis, dissection or occlusion.  Left carotid system: Left common and internal carotid arteries widely patent without stenosis, dissection or occlusion.  Vertebral arteries: Left vertebral artery arises directly from the aortic arch. Right vertebral artery slightly dominant. Vertebral arteries patent without stenosis, dissection or occlusion.  Skeleton: No discrete or worrisome osseous lesions. Mild-to-moderate spondylosis present at C4-5 through C6-7.  Other neck: No other acute soft tissue abnormality within the neck. Small intramuscular lipoma noted at the left posterolateral neck. No other mass or adenopathy.  Upper chest: Left-sided pacemaker/AICD in place. Small layering bilateral pleural effusions. Scattered ground-glass opacity with interlobular septal thickening within the visualized lungs consistent with pulmonary interstitial edema.  Review of the  MIP images confirms the above findings  CTA HEAD FINDINGS  Anterior circulation: Both internal carotid arteries widely patent to the termini without stenosis. A1 segments widely patent. Normal anterior communicating artery complex. Both anterior cerebral arteries widely patent to their distal aspects without stenosis. No M1 stenosis or occlusion. Normal MCA bifurcations. Distal MCA branches well perfused and symmetric.  Posterior circulation: Both V4 segments patent to the vertebrobasilar junction without stenosis. Both PICA origins patent and normal. Basilar widely patent to its distal aspect without stenosis. Superior cerebellar arteries patent bilaterally. Both PCAs primarily supplied via the basilar and are well perfused to there distal aspects.  Venous sinuses: Patent allowing for timing the contrast bolus.  Anatomic variants: None significant.  No aneurysm.  Review of the MIP images confirms the above findings  IMPRESSION: 1. Negative CTA of the head and neck. No large vessel occlusion, hemodynamically significant stenosis, or other acute vascular abnormality. 2. Small layering bilateral pleural effusions with associated pulmonary interstitial edema, suggesting CHF.  These results were communicated to Dr. KLorrin Goodellat 7:57 pm on 01/24/2021 by text page via the [facility] system.  Electronically Signed   By: BPincus BadderD.  On: 01/24/2021 20:07 CT HEAD CODE STROKE WO CONTRAST CLINICAL DATA:  Code stroke. Initial evaluation for acute right-sided weakness.  EXAM: CT HEAD WITHOUT CONTRAST  TECHNIQUE: Contiguous axial images were obtained from the base of the skull through the vertex without intravenous contrast.  COMPARISON:  None available.  FINDINGS: Brain: Cerebral volume within normal limits for age. Extensive patchy and confluent hypodensity involving the supratentorial white matter, most likely related chronic microvascular ischemic  disease, advanced for age. Superimposed remote lacunar infarct at the posterior right frontal corona radiata. Small remote left cerebellar infarct noted.  No acute intracranial hemorrhage. No acute large vessel territory infarct. No mass lesion, midline shift or mass effect. No hydrocephalus or are extra-axial fluid collection.  Vascular: No hyperdense vessel.  Skull: Scalp soft tissues and calvarium within normal limits.  Sinuses/Orbits: Globes orbital soft tissues within normal limits. Chronic left maxillary sinusitis noted. Paranasal sinuses are otherwise clear. No mastoid effusion.  Other: None.  ASPECTS [facility] Stroke Program Early CT Score)  - Ganglionic level infarction (caudate, lentiform nuclei, internal capsule, insula, M1-M3 cortex): 7  - Supraganglionic infarction (M4-M6 cortex): 3  Total score (0-10 with 10 being normal): 10  IMPRESSION: 1. No acute intracranial abnormality. 2. ASPECTS is 10. 3. Advanced chronic microvascular ischemic disease for age, with small remote infarcts involving the posterior right frontal corona radiata and left cerebellum.  These results were communicated to Dr. Lorrin Goodell at 7:44 pm on 01/24/2021 by text page via the [facility] messaging system.  Electronically Signed   By: Jeannine Boga M.D.   On: 01/24/2021 19:47    Eppie Gibson, MD 01/25/2021, 11:54 AM PGY-1, [place] Intern pager: [phone], text pages welcome

## 2021-01-25 NOTE — TOC Benefit Eligibility Note (Signed)
Patient Product/process development scientist completed.    The patient is currently admitted and upon discharge could be taking Eliquis 5 mg.  The current 30 day co-pay is, $4.00.   The patient is insured through [employer] Part D     Roland Earl, CPhT Pharmacy Patient Advocate Specialist [facility] Patient Advocate Team Direct Number: [phone]  Fax: [phone]

## 2021-01-25 NOTE — Plan of Care (Signed)

## 2021-01-25 NOTE — Evaluation (Signed)
Physical Therapy Evaluation Patient Details Name: William Henderson MRN: [id number] DOB: 05/27/1965 Today's Date: 01/25/2021  History of Present Illness  Pt is a 55 y/o male admitted 12/20 with R sided weakness. CT negative, MRI pending. PMH inculdes: cardiac arrest, CHF, HTN, afib, ICD placement.  Clinical Impression  Patient presents with mobility back to baseline.  He was able to ambulate and negotiate stairs appropriate for home entry.  Education for stroke warning signs and prevention completed as well as discussing further work up as complains of intermittent fatigue.  Did have HR elevation to 120's but pt asymptomatic.  No further skilled PT needs.  Will sign off.        Recommendations for follow up therapy are one component of a multi-disciplinary discharge planning process, led by the attending physician.  Recommendations may be updated based on patient status, additional functional criteria and insurance authorization.  Follow Up Recommendations No PT follow up    Assistance Recommended at Discharge PRN  Functional Status Assessment    Equipment Recommendations  None recommended by PT    Recommendations for Other Services       Precautions / Restrictions Precautions Precautions: None Restrictions Weight Bearing Restrictions: No      Mobility  Bed Mobility Overal bed mobility: Independent                  Transfers Overall transfer level: Independent Equipment used: None                    Ambulation/Gait Ambulation/Gait assistance: Independent Gait Distance (Feet): 400 Feet Assistive device: None Gait Pattern/deviations: Step-through pattern;WFL(Within Functional Limits)       General Gait Details: initiated DGI, but pt reported unable to change speeds, then proceeded to "dance" with walking demonstrating intact balance  Stairs Stairs: Yes Stairs assistance: Modified independent (Device/Increase time) Stair Management: One rail  Right;Forwards;Alternating pattern Number of Stairs: 10 General stair comments: no LOB noted  Wheelchair Mobility    Modified Rankin (Stroke Patients Only) Modified Rankin (Stroke Patients Only) Pre-Morbid Rankin Score: No symptoms Modified Rankin: Slight disability     Balance Overall balance assessment: Independent                                           Pertinent Vitals/Pain Pain Assessment: No/denies pain    Home Living Family/patient expects to be discharged to:: Private residence Living Arrangements: Alone   Type of Home: House Home Access: Stairs to enter Entrance Stairs-Rails: Doctor, general practice of Steps: 8   Home Layout: One level Home Equipment: None      Prior Function Prior Level of Function : Independent/Modified Independent               ADLs Comments: works as Dealer: Right    Extremity/Trunk Assessment   Upper Extremity Assessment Upper Extremity Assessment: Defer to OT evaluation    Lower Extremity Assessment Lower Extremity Assessment: Overall WFL for tasks assessed       Communication   Communication: No difficulties  Cognition Arousal/Alertness: Awake/alert Behavior During Therapy: WFL for tasks assessed/performed Overall Cognitive Status: Within Functional Limits for tasks assessed  General Comments General comments (skin integrity, edema, etc.): HR up to 120's at times with mobility; reviewed BE FAST for stroke warning signs and discussed prevention    Exercises     Assessment/Plan    PT Assessment Patient does not need any further PT services  PT Problem List         PT Treatment Interventions      PT Goals (Current goals can be found in the Care Plan section)  Acute Rehab PT Goals PT Goal Formulation: All assessment and education complete, DC therapy    Frequency      Barriers to discharge        Co-evaluation PT/OT/SLP Co-Evaluation/Treatment: Yes Reason for Co-Treatment: For patient/therapist safety PT goals addressed during session: Mobility/safety with mobility OT goals addressed during session: ADL's and self-care       AM-PAC PT "6 Clicks" Mobility  Outcome Measure Help needed turning from your back to your side while in a flat bed without using bedrails?: None Help needed moving from lying on your back to sitting on the side of a flat bed without using bedrails?: None Help needed moving to and from a bed to a chair (including a wheelchair)?: None Help needed standing up from a chair using your arms (e.g., wheelchair or bedside chair)?: None Help needed to walk in hospital room?: None Help needed climbing 3-5 steps with a railing? : None 6 Click Score: 24    End of Session   Activity Tolerance: Patient tolerated treatment well Patient left: in chair;with call bell/phone within reach (in chair at bedside)   PT Visit Diagnosis: Muscle weakness (generalized) (M62.81)    Time: 8341-9622 PT Time Calculation (min) (ACUTE ONLY): 23 min   Charges:   PT Evaluation $PT Eval Low Complexity: 1 Low          William Henderson, PT Acute Rehabilitation Services [phone] Office:[phone] 01/25/2021   William Henderson 01/25/2021, 1:37 PM

## 2021-01-25 NOTE — ED Notes (Signed)
Provider at bedside

## 2021-01-25 NOTE — Evaluation (Signed)
Occupational Therapy Evaluation Patient Details Name: Alissa Decosmo MRN: [id number] DOB: [birth year] Today's Date: 01/25/2021   History of Present Illness Pt is a 55 y/o male admitted 12/20 with R sided weakness. CT negative, MRI pending. PMH inculdes: cardiac arrest, CHF, HTN, afib, ICD placement.   Clinical Impression   PTA patient independent, working and driving. Admitted for above and presenting at baseline independent level for ADLs, transfers and mobility.  No deficits seen in cognition, strength, coordination or balance. Pt educated on BEFAST, safety.  Based on performance today, no further OT needs identified and OT will sign off.       Recommendations for follow up therapy are one component of a multi-disciplinary discharge planning process, led by the attending physician.  Recommendations may be updated based on patient status, additional functional criteria and insurance authorization.   Follow Up Recommendations  No OT follow up    Assistance Recommended at Discharge None  Functional Status Assessment  Patient has not had a recent decline in their functional status  Equipment Recommendations  None recommended by OT    Recommendations for Other Services       Precautions / Restrictions Restrictions Weight Bearing Restrictions: No      Mobility Bed Mobility Overal bed mobility: Independent                  Transfers Overall transfer level: Independent                        Balance Overall balance assessment: No apparent balance deficits (not formally assessed)                                         ADL either performed or assessed with clinical judgement   ADL Overall ADL's : Independent                                     Functional mobility during ADLs: Independent       Vision   Vision Assessment?: No apparent visual deficits     Perception     Praxis      Pertinent Vitals/Pain Pain  Assessment: No/denies pain     Hand Dominance Right   Extremity/Trunk Assessment Upper Extremity Assessment Upper Extremity Assessment: Overall WFL for tasks assessed   Lower Extremity Assessment Lower Extremity Assessment: Defer to PT evaluation       Communication Communication Communication: No difficulties   Cognition Arousal/Alertness: Awake/alert Behavior During Therapy: WFL for tasks assessed/performed Overall Cognitive Status: Within Functional Limits for tasks assessed                                       General Comments  VSS    Exercises     Shoulder Instructions      Home Living Family/patient expects to be discharged to:: Private residence Living Arrangements: Alone   Type of Home: House Home Access: Stairs to enter [employer] of Steps: 8 Entrance Stairs-Rails: Right;Left Home Layout: One level     Bathroom Shower/Tub: Teacher, early years/pre: Standard     Home Equipment: None          Prior Functioning/Environment Prior Level of Function :  Independent/Modified Independent               ADLs Comments: works as Engineer, maintenance Problem List:        OT Treatment/Interventions:      OT Goals(Current goals can be found in the care plan section) Acute Rehab OT Goals Patient Stated Goal: home OT Goal Formulation: With patient Time For Goal Achievement: 02/08/21 Potential to Achieve Goals: Good  OT Frequency:     Barriers to D/C:            Co-evaluation PT/OT/SLP Co-Evaluation/Treatment: Yes Reason for Co-Treatment: For patient/therapist safety   OT goals addressed during session: ADL's and self-care      AM-PAC OT "6 Clicks" Daily Activity     Outcome Measure Help from another person eating meals?: None Help from another person taking care of personal grooming?: None Help from another person toileting, which includes using toliet, bedpan, or urinal?: None Help from another  person bathing (including washing, rinsing, drying)?: None Help from another person to put on and taking off regular upper body clothing?: None Help from another person to put on and taking off regular lower body clothing?: None 6 Click Score: 24   End of Session Nurse Communication: Mobility status  Activity Tolerance: Patient tolerated treatment well Patient left: in chair;with call bell/phone within reach  OT Visit Diagnosis: Other symptoms and signs involving the nervous system (R29.898)                Time: 9604-5409 OT Time Calculation (min): 26 min Charges:  OT General Charges $OT Visit: 1 Visit OT Evaluation $OT Eval Low Complexity: 1 Low  Barry Brunner, OT Acute Rehabilitation Services Pager [phone] Office [phone]   Chancy Milroy 01/25/2021, 1:33 PM

## 2021-01-25 NOTE — Progress Notes (Signed)
FPTS Brief Progress Note  S: No concerns. Does not feel as if he is volume overloaded.    O: BP (!) 133/100 (BP Location: Right Arm)    Pulse 89    Temp 98.6 F (37 C) (Oral)    Resp 19    Ht 5\' 10"  (1.778 m)    Wt 120.3 kg    SpO2 95%    BMI 38.05 kg/m   General: Appears well, no acute distress. Age appropriate. Respiratory: normal effort, speaking in full sentences Extremities: No LE edema Neuro: alert and oriented, no focal deficits Psych: normal affect   A/P: Transient R-Sided Weakness   TIA vs CVA - Discharge 12/22 pending MRI (Nurse messaged to ask if this can be done overnight) - Orders reviewed. Labs for AM ordered, which was adjusted as needed.   1/23, DO 01/25/2021, 10:10 PM PGY-3, [facility] Night Resident  Please page [phone] with questions.

## 2021-01-25 NOTE — ED Notes (Signed)
Lott MD notified of elevated BP

## 2021-01-26 ENCOUNTER — Observation Stay (HOSPITAL_COMMUNITY): Payer: Medicare Other

## 2021-01-26 ENCOUNTER — Other Ambulatory Visit (HOSPITAL_COMMUNITY): Payer: Self-pay

## 2021-01-26 DIAGNOSIS — I4891 Unspecified atrial fibrillation: Secondary | ICD-10-CM | POA: Diagnosis not present

## 2021-01-26 DIAGNOSIS — G459 Transient cerebral ischemic attack, unspecified: Secondary | ICD-10-CM

## 2021-01-26 DIAGNOSIS — I480 Paroxysmal atrial fibrillation: Secondary | ICD-10-CM | POA: Diagnosis not present

## 2021-01-26 DIAGNOSIS — I639 Cerebral infarction, unspecified: Secondary | ICD-10-CM | POA: Diagnosis not present

## 2021-01-26 DIAGNOSIS — I10 Essential (primary) hypertension: Secondary | ICD-10-CM | POA: Diagnosis not present

## 2021-01-26 DIAGNOSIS — E785 Hyperlipidemia, unspecified: Secondary | ICD-10-CM

## 2021-01-26 LAB — CBC
HCT: 44 % (ref 39.0–52.0)
Hemoglobin: 14.8 g/dL (ref 13.0–17.0)
MCH: 31.4 pg (ref 26.0–34.0)
MCHC: 33.6 g/dL (ref 30.0–36.0)
MCV: 93.2 fL (ref 80.0–100.0)
Platelets: 186 10*3/uL (ref 150–400)
RBC: 4.72 MIL/uL (ref 4.22–5.81)
RDW: 14.8 % (ref 11.5–15.5)
WBC: 9.8 10*3/uL (ref 4.0–10.5)
nRBC: 0 % (ref 0.0–0.2)

## 2021-01-26 LAB — BASIC METABOLIC PANEL
Anion gap: 9 (ref 5–15)
BUN: 15 mg/dL (ref 6–20)
CO2: 24 mmol/L (ref 22–32)
Calcium: 8.5 mg/dL — ABNORMAL LOW (ref 8.9–10.3)
Chloride: 102 mmol/L (ref 98–111)
Creatinine, Ser: 1.9 mg/dL — ABNORMAL HIGH (ref 0.61–1.24)
GFR, Estimated: 41 mL/min — ABNORMAL LOW (ref >60)
Glucose, Bld: 102 mg/dL — ABNORMAL HIGH (ref 70–99)
Potassium: 3.5 mmol/L (ref 3.5–5.1)
Sodium: 135 mmol/L (ref 135–145)

## 2021-01-26 LAB — HIV ANTIBODY (ROUTINE TESTING W REFLEX): HIV Screen 4th Generation wRfx: NONREACTIVE

## 2021-01-26 MED ORDER — LORAZEPAM 0.5 MG PO TABS
0.5000 mg | ORAL_TABLET | Freq: Once | ORAL | Status: DC
  Filled 2021-01-26: qty 1

## 2021-01-26 MED ORDER — ATORVASTATIN CALCIUM 80 MG PO TABS
80.0000 mg | ORAL_TABLET | Freq: Every day | ORAL | 0 refills | Status: AC
  Filled 2021-01-26: qty 30, 30d supply, fill #0

## 2021-01-26 MED ORDER — FUROSEMIDE 40 MG PO TABS: 40.0000 mg | ORAL_TABLET | Freq: Every day | ORAL | Status: DC

## 2021-01-26 MED ORDER — CARVEDILOL 25 MG PO TABS
25.0000 mg | ORAL_TABLET | Freq: Two times a day (BID) | ORAL | 0 refills | Status: AC
  Filled 2021-01-26: qty 60, 30d supply, fill #0

## 2021-01-26 MED ORDER — APIXABAN 5 MG PO TABS
5.0000 mg | ORAL_TABLET | Freq: Two times a day (BID) | ORAL | 0 refills | Status: DC
  Filled 2021-01-26: qty 60, 30d supply, fill #0

## 2021-01-26 NOTE — Plan of Care (Signed)
°  Problem: Education: Goal: Knowledge of General Education information will improve Description: Including pain rating scale, medication(s)/side effects and non-pharmacologic comfort measures Outcome: Progressing   Problem: Health Behavior/Discharge Planning: Goal: Ability to manage health-related needs will improve Outcome: Progressing   Problem: Clinical Measurements: Goal: Ability to maintain clinical measurements within normal limits will improve Outcome: Progressing Goal: Will remain free from infection Outcome: Progressing Goal: Diagnostic test results will improve Outcome: Progressing Goal: Respiratory complications will improve Outcome: Progressing Goal: Cardiovascular complication will be avoided Outcome: Progressing   Problem: Activity: Goal: Risk for activity intolerance will decrease Outcome: Progressing   Problem: Nutrition: Goal: Adequate nutrition will be maintained Outcome: Progressing   Problem: Coping: Goal: Level of anxiety will decrease Outcome: Progressing   Problem: Elimination: Goal: Will not experience complications related to bowel motility Outcome: Progressing Goal: Will not experience complications related to urinary retention Outcome: Progressing   Problem: Pain Managment: Goal: General experience of comfort will improve Outcome: Progressing   Problem: Safety: Goal: Ability to remain free from injury will improve Outcome: Progressing   Problem: Skin Integrity: Goal: Risk for impaired skin integrity will decrease Outcome: Progressing   Problem: Education: Goal: Knowledge of disease or condition will improve Outcome: Progressing Goal: Knowledge of secondary prevention will improve (SELECT ALL) Outcome: Progressing Goal: Knowledge of patient specific risk factors will improve (INDIVIDUALIZE FOR PATIENT) Outcome: Progressing   Problem: Self-Care: Goal: Ability to participate in self-care as condition permits will improve Outcome:  Progressing   Problem: Nutrition: Goal: Risk of aspiration will decrease Outcome: Progressing   Problem: Ischemic Stroke/TIA Tissue Perfusion: Goal: Complications of ischemic stroke/TIA will be minimized Outcome: Progressing

## 2021-01-26 NOTE — TOC Transition Note (Signed)
Transition of Care [facility]) - CM/SW Discharge Note   Patient Details  Name: William Henderson MRN: [id number] Date of Birth: 10-14-1965  [facility]) CM/SW Contact:  Kermit Balo, RN Phone Number: 01/26/2021, 3:40 PM   Clinical Narrative:    Patient is discharging home with self care. No needs per PT/OT and no DME needs.  Pt has transport home.   Final next level of care: Home/Self Care Barriers to Discharge: No Barriers Identified   Patient Goals and CMS Choice        Discharge Placement                       Discharge Plan and Services   Discharge Planning Services: CM Consult                                 Social Determinants of Health (SDOH) Interventions     Readmission Risk Interventions No flowsheet data found.

## 2021-01-26 NOTE — Discharge Summary (Signed)
[facility] Discharge Summary  Patient name: William Henderson Medical record number: [id number] Date of birth: [birth year] Age: 55 y.o. 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 Adin Hector evaluation on 01/26/2021 Temp:  [98.2 F (36.8 C)-98.9 F (37.2 C)] 98.7 F (37.1 C) (12/22 0421) Pulse Rate:  [80-95] 80 (12/22 0421) Resp:  [14-27] 16 (12/22 0421) BP: (121-141)/(85-107) 128/105 (12/22 0421) SpO2:  [92 %-100 %] 97 % (12/22 0421) Weight:  [120.3 kg] 120.3 kg (12/21 1504) Physical Exam: General: Well-nourished 55 year old male, NAD Cardiovascular: Irregularly irregular, normal rate, no murmur, pulses 2+ throughout, no JVD Respiratory: Bibasilar crackles left greater than right, tachypneic Abdomen: Soft, nontender, nondistended Extremities: Without edema or deformity  Brief Hospital Course:  William Henderson is a 55 y.o. 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 fib with inadequate anticoagulation, hypertension, CKD 3B who presented with transient right-sided weakness.  Plan by Problem:  TIA vs CVA Patient presented to the [facility] ED on 12/20 with acute onset of right-sided weakness.  Upon arrival to the ED, his symptoms had resolved.  Stroke work-up with CTA head, CTA head/neck did not show any acute intracranial abnormality but did show small remote infarcts in the posterior right frontal corona radiata  and left cerebellum consistent with his known CVA in 2017.  Echocardiogram was obtained and showed advanced heart failure consistent with his prior studies.  There is no evidence of thrombus or shunt.  MRI brain was unable to be obtained because the patient was unable to lay flat on the table.  He was able to tolerate a repeat CT head without contrast as long as the head of the table was mildly elevated.  He was evaluated by OT, PT with no recommendations for follow-up. Our neurology team was activated on arrival and followed throughout his hospitalization.  They felt that the most likely cause of his TIA was an embolism secondary to his atrial fibrillation with inadequate anticoagulation as discussed below.  He was discharged in stable condition on 12/22 on Eliquis 5 mg twice daily per neurology's recommendations.  Persistent atrial fibrillation with an adequate anticoagulation Patient has a longstanding known history of A. fib.  He was previously prescribed Eliquis but had not been taking it due to inaffordability.  He reports that on 12/17 he started taking 1 2.5 mg tablet daily.  We determined that patient did not meet any criteria for dose reduction of his Eliquis and would be more appropriately treated with 5 mg twice daily.  He was discharged on this appropriate anticoagulation regimen.  Other chronic conditions were medically managed with home medications and formulary alternatives as necessary   Issues for Follow Up:  Patient is being discharged on Eliquis 5 mg twice daily for anticoagulation.  Recommend periodic medication audits to ensure adherence in order to prevent future events. We increased the patient's Coreg from 25 mg daily to 25 mg twice daily.  Please follow-up to monitor for tolerability. We transitioned this patient from Crestor 20 mg daily to atorvastatin 80 mg daily due  to his borderline GFR.  Please continue to monitor lipid levels and kidney function. Patient's amlodipine and  Norvasc were held on admission.  On discharge we reinitiated Norvasc but held lisinopril.  Consider restarting lisinopril at hospital follow-up after repeat BMP and blood pressure assessment.   Significant Labs and Imaging:  Recent Labs  Lab 01/24/21 1930 01/24/21 1943 01/25/21 0240 01/26/21 0219  WBC 9.0  --  9.2 9.8  HGB 15.8 17.0 15.0 14.8  HCT 49.1 50.0 45.3 44.0  PLT 181  --  197 186   Recent Labs  Lab 01/24/21 1930 01/24/21 1943 01/25/21 0240 01/26/21 0219  NA 138 140 136 135  K 3.6 3.4* 4.0 3.5  CL 105 106 103 102  CO2 22  --  23 24  GLUCOSE 117* 115* 103* 102*  BUN 20 21* 19 15  CREATININE 1.98* 1.90* 2.00* 1.90*  CALCIUM 9.1  --  8.7* 8.5*  ALKPHOS 46  --   --   --   AST 22  --   --   --   ALT 17  --   --   --   ALBUMIN 3.5  --   --   --    CT HEAD WO CONTRAST ( )  Result Date: 01/26/2021 CLINICAL DATA:  Acute neuro deficit.  Rule out stroke. EXAM: CT HEAD WITHOUT CONTRAST TECHNIQUE: Contiguous axial images were obtained from the base of the skull through the vertex without intravenous contrast. COMPARISON:  CT head 01/24/2021 FINDINGS: Brain: Moderate to advanced white matter changes bilaterally. No acute infarct, hemorrhage, mass. No change from the prior study. Vascular: Negative for hyperdense vessel Skull: Negative Sinuses/Orbits: Extensive mucosal edema left maxillary sinus. Remaining sinuses clear. Negative orbit Other: None IMPRESSION: No acute abnormality no change from the prior study. Moderate to advanced chronic microvascular ischemic change in the white matter. Electronically Signed   By: Marlan Palau M.D.   On: 01/26/2021 14:57   DG Chest Portable 1 View  Result Date: 01/24/2021 CLINICAL DATA:  Shortness of breath EXAM: PORTABLE CHEST 1 VIEW COMPARISON:  11/22/2020 FINDINGS: Cardiac shadow is enlarged in size. Defibrillator is again noted and stable. The lungs are well aerated bilaterally. No focal infiltrate or effusion is seen. No bony  abnormality is noted. IMPRESSION: No acute abnormality noted. Electronically Signed   By: Alcide Clever M.D.   On: 01/24/2021 20:49   ECHOCARDIOGRAM COMPLETE  Result Date: 01/25/2021    ECHOCARDIOGRAM REPORT   Patient Name:   TIJUAN DANTES Date of Exam: 01/25/2021 Medical Rec #:  [id number]    Height:       70.0 in Accession #:    [id number]   Weight:       282.0 lb Date of Birth:  1965-06-13    BSA:          2.415 m Patient Age:    55 years     BP:           141/98 mmHg Patient Gender: M            HR:           110 bpm. Exam Location:  Inpatient Procedure: 2D Echo, Cardiac Doppler, Color Doppler and Intracardiac            Opacification Agent Indications:    TIA  History:        Patient has no prior history of Echocardiogram examinations.  Arrythmias:Atrial Fibrillation; Risk Factors:Hypertension.  Sonographer:    Glo Herring Referring Phys: [id number] [place]  1. There is no left ventricular thrombus seen (with Definity contrast). Left ventricular ejection fraction, by estimation, is 20 to 25%. The left ventricle has severely decreased function. The left ventricle demonstrates global hypokinesis. The left ventricular internal cavity size was moderately dilated. There is mild eccentric left ventricular hypertrophy. Left ventricular diastolic function could not be evaluated.  2. Right ventricular systolic function is moderately reduced. The right ventricular size is normal. There is moderately elevated pulmonary artery systolic pressure.  3. Left atrial size was severely dilated.  4. Right atrial size was moderately dilated.  5. The pericardial effusion is localized near the right atrium.  6. The mitral valve is normal in structure. Mild to moderate mitral valve regurgitation.  7. The aortic valve is tricuspid. Aortic valve regurgitation is not visualized. No aortic stenosis is present.  8. There is mild dilatation of the ascending aorta, measuring 41 mm.  9. The inferior vena  cava is dilated in size with >50% respiratory variability, suggesting right atrial pressure of 8 mmHg. Comparison(s): A prior study was performed on 04/18/2017 (outside report). The left ventricular function is unchanged. FINDINGS  Left Ventricle: There is no left ventricular thrombus seen (with Definity contrast). Left ventricular ejection fraction, by estimation, is 20 to 25%. The left ventricle has severely decreased function. The left ventricle demonstrates global hypokinesis.  Definity contrast agent was given IV to delineate the left ventricular endocardial borders. The left ventricular internal cavity size was moderately dilated. There is mild eccentric left ventricular hypertrophy. Left ventricular diastolic function could  not be evaluated due to atrial fibrillation. Left ventricular diastolic function could not be evaluated. Right Ventricle: The right ventricular size is normal. No increase in right ventricular wall thickness. Right ventricular systolic function is moderately reduced. There is moderately elevated pulmonary artery systolic pressure. The tricuspid regurgitant velocity is 3.35 m/s, and with an assumed right atrial pressure of 8 mmHg, the estimated right ventricular systolic pressure is [id number] mmHg. Left Atrium: Left atrial size was severely dilated. Right Atrium: Right atrial size was moderately dilated. Pericardium: Trivial pericardial effusion is present. The pericardial effusion is localized near the right atrium. Mitral Valve: The mitral valve is normal in structure. Mild to moderate mitral valve regurgitation, with centrally-directed jet. Tricuspid Valve: The tricuspid valve is not well visualized. Tricuspid valve regurgitation is not demonstrated. Aortic Valve: The aortic valve is tricuspid. Aortic valve regurgitation is not visualized. No aortic stenosis is present. Pulmonic Valve: The pulmonic valve was normal in structure. Pulmonic valve regurgitation is not visualized. Aorta: The  aortic root is normal in size and structure. There is mild dilatation of the ascending aorta, measuring 41 mm. Venous: The inferior vena cava is dilated in size with greater than 50% respiratory variability, suggesting right atrial pressure of 8 mmHg. IAS/Shunts: No atrial level shunt detected by color flow Doppler. Additional Comments: A device lead is visualized in the right ventricle.  LEFT VENTRICLE PLAX 2D LVIDd:         6.20 cm LVIDs:         5.60 cm LV PW:         1.40 cm LV IVS:        1.40 cm LVOT diam:     2.20 cm LVOT Area:     3.80 cm  LV Volumes (MOD) LV vol d, MOD A2C: 197.0 ml LV vol d, MOD A4C: 238.0  ml LV vol s, MOD A2C: 150.0 ml LV vol s, MOD A4C: 187.0 ml LV SV MOD A2C:     47.0 ml LV SV MOD A4C:     238.0 ml LV SV MOD BP:      49.7 ml RIGHT VENTRICLE RV Basal diam:  3.90 cm RV Mid diam:    1.90 cm RV S prime:     6.18 cm/s LEFT ATRIUM              Index        RIGHT ATRIUM           Index LA diam:        4.90 cm  2.03 cm/m   RA Area:     26.70 cm LA Vol (A2C):   113.0 ml 46.78 ml/m  RA Volume:   87.00 ml  36.02 ml/m LA Vol (A4C):   129.0 ml 53.41 ml/m LA Biplane Vol: 126.0 ml 52.16 ml/m   AORTA Ao Root diam: 3.30 cm Ao Asc diam:  4.10 cm TRICUSPID VALVE TR Peak grad:   44.9 mmHg TR Vmax:        335.00 cm/s  SHUNTS Systemic Diam: 2.20 cm Dani Gobble Croitoru MD Electronically signed by Sanda Klein MD Signature Date/Time: 01/25/2021/2:49:01 PM    Final    CT HEAD CODE STROKE WO CONTRAST  Result Date: 01/24/2021 CLINICAL DATA:  Code stroke. Initial evaluation for acute right-sided weakness. EXAM: CT HEAD WITHOUT CONTRAST TECHNIQUE: Contiguous axial images were obtained from the base of the skull through the vertex without intravenous contrast. COMPARISON:  None available. FINDINGS: Brain: Cerebral volume within normal limits for age. Extensive patchy and confluent hypodensity involving the supratentorial white matter, most likely related chronic microvascular ischemic disease, advanced for  age. Superimposed remote lacunar infarct at the posterior right frontal corona radiata. Small remote left cerebellar infarct noted. No acute intracranial hemorrhage. No acute large vessel territory infarct. No mass lesion, midline shift or mass effect. No hydrocephalus or are extra-axial fluid collection. Vascular: No hyperdense vessel. Skull: Scalp soft tissues and calvarium within normal limits. Sinuses/Orbits: Globes orbital soft tissues within normal limits. Chronic left maxillary sinusitis noted. Paranasal sinuses are otherwise clear. No mastoid effusion. Other: None. ASPECTS [facility] Stroke Program Early CT Score) - Ganglionic level infarction (caudate, lentiform nuclei, internal capsule, insula, M1-M3 cortex): 7 - Supraganglionic infarction (M4-M6 cortex): 3 Total score (0-10 with 10 being normal): 10 IMPRESSION: 1. No acute intracranial abnormality. 2. ASPECTS is 10. 3. Advanced chronic microvascular ischemic disease for age, with small remote infarcts involving the posterior right frontal corona radiata and left cerebellum. These results were communicated to Dr. Lorrin Goodell at 7:44 pm on 01/24/2021 by text page via the [facility] messaging system. Electronically Signed   By: Jeannine Boga M.D.   On: 01/24/2021 19:47   CT ANGIO HEAD NECK W WO CM (CODE STROKE)  Result Date: 01/24/2021 CLINICAL DATA:  Initial evaluation for neuro deficit, stroke suspected. EXAM: CT ANGIOGRAPHY HEAD AND NECK TECHNIQUE: Multidetector CT imaging of the head and neck was performed using the standard protocol during bolus administration of intravenous contrast. Multiplanar CT image reconstructions and MIPs were obtained to evaluate the vascular anatomy. Carotid stenosis measurements (when applicable) are obtained utilizing NASCET criteria, using the distal internal carotid diameter as the denominator. CONTRAST:  48mL OMNIPAQUE IOHEXOL 350 MG/ML SOLN COMPARISON:  Prior head CT from earlier the same day. FINDINGS: CTA NECK  FINDINGS Aortic arch: Visualized aortic arch normal caliber with normal branch pattern.  No stenosis about the origin of the great vessels. Right carotid system: Right common and internal carotid arteries widely patent without stenosis, dissection or occlusion. Left carotid system: Left common and internal carotid arteries widely patent without stenosis, dissection or occlusion. Vertebral arteries: Left vertebral artery arises directly from the aortic arch. Right vertebral artery slightly dominant. Vertebral arteries patent without stenosis, dissection or occlusion. Skeleton: No discrete or worrisome osseous lesions. Mild-to-moderate spondylosis present at C4-5 through C6-7. Other neck: No other acute soft tissue abnormality within the neck. Small intramuscular lipoma noted at the left posterolateral neck. No other mass or adenopathy. Upper chest: Left-sided pacemaker/AICD in place. Small layering bilateral pleural effusions. Scattered ground-glass opacity with interlobular septal thickening within the visualized lungs consistent with pulmonary interstitial edema. Review of the MIP images confirms the above findings CTA HEAD FINDINGS Anterior circulation: Both internal carotid arteries widely patent to the termini without stenosis. A1 segments widely patent. Normal anterior communicating artery complex. Both anterior cerebral arteries widely patent to their distal aspects without stenosis. No M1 stenosis or occlusion. Normal MCA bifurcations. Distal MCA branches well perfused and symmetric. Posterior circulation: Both V4 segments patent to the vertebrobasilar junction without stenosis. Both PICA origins patent and normal. Basilar widely patent to its distal aspect without stenosis. Superior cerebellar arteries patent bilaterally. Both PCAs primarily supplied via the basilar and are well perfused to there distal aspects. Venous sinuses: Patent allowing for timing the contrast bolus. Anatomic variants: None  significant.  No aneurysm. Review of the MIP images confirms the above findings IMPRESSION: 1. Negative CTA of the head and neck. No large vessel occlusion, hemodynamically significant stenosis, or other acute vascular abnormality. 2. Small layering bilateral pleural effusions with associated pulmonary interstitial edema, suggesting CHF. These results were communicated to Dr. Lorrin Goodell at 7:57 pm on 01/24/2021 by text page via the [facility] messaging system. Electronically Signed   By: Jeannine Boga M.D.   On: 01/24/2021 20:07     Results/Tests Pending at Time of Discharge: None  Discharge Medications:  Allergies as of 01/26/2021       Reactions   Cephalexin Hives        Medication List     STOP taking these medications    lisinopril 10 MG tablet Commonly known as: ZESTRIL   rosuvastatin 20 MG tablet Commonly known as: CRESTOR       TAKE these medications    amLODipine 10 MG tablet Commonly known as: NORVASC Take 10 mg by mouth at bedtime.   apixaban 5 MG Tabs tablet Commonly known as: ELIQUIS Take 1 tablet (5 mg total) by mouth 2 (two) times daily. What changed:  medication strength how much to take   atorvastatin 80 MG tablet Commonly known as: LIPITOR Take 1 tablet (80 mg total) by mouth daily. Start taking on: January 27, 2021   carvedilol 25 MG tablet Commonly known as: COREG Take 1 tablet (25 mg total) by mouth 2 (two) times daily with a meal. What changed: when to take this   furosemide 40 MG tablet Commonly known as: LASIX Take 40 mg by mouth at bedtime.   Pacerone 200 MG tablet Generic drug: amiodarone Take 200 mg by mouth at bedtime.        Discharge Instructions: Please refer to Patient Instructions section of EMR for full details.  Patient was counseled important signs and symptoms that should prompt return to medical care, changes in medications, dietary instructions, activity restrictions, and follow up appointments.   Follow-Up  Appointments:  Follow-up Information     Candi Leash, PA-C. Schedule an appointment as soon as possible for a visit in 1 week(s).   Specialty: Physician Assistant Contact information: [place] [address] [phone]                 Gifford Shave, MD 01/26/2021, 3:20 PM PGY-3, [place]

## 2021-01-26 NOTE — Discharge Instructions (Addendum)
Dear Willeen Niece,  Thank you for letting us participate in your care. You were hospitalized for right-sided weakness and diagnosed with TIA (transient ischemic attack). You were treated with increasing your dose of Eliquis.  Thankfully your scans did not show any lasting neurologic damage.  Staying on your Eliquis and heart failure medications is key to preventing you from having issues like this again in the future.  We have started back home dose of Norvasc and have instructed to take your Coreg 25 mg twice daily. We are holding lisinopril until you see your PCP.   We are stopping the Crestor and starting Atorvastatin 80 mg.  POST-HOSPITAL & CARE INSTRUCTIONS Be sure that you are taking your Eliquis twice daily every single day, this will help to prevent you from having a stroke in the future. We are increasing the dose of your Coreg from 25 mg once daily to 25 mg twice daily. We are changing your cholesterol medicine from Crestor to Lipitor.  This is due to your kidney function. You will need to follow-up with your primary care doctor for hospital follow-up visit. Go to your follow up appointments (listed below)   DOCTOR'S APPOINTMENT   Follow up with your PCP   Take care and be well!  Family Medicine Teaching Service Inpatient Team [place]  [facility]  [address] [place], [place] [address] [phone]

## 2021-01-26 NOTE — Progress Notes (Signed)
STROKE TEAM PROGRESS NOTE   INTERVAL HISTORY Patient is seen in bed resting comfortably.  Patient states is unable to lay flat for an MRI and she will cancel it.  Vital signs are stable.  Neurological exam is unchanged  Vitals:   01/26/21 0421 01/26/21 0838 01/26/21 0839 01/26/21 1111  BP: (!) 128/105 (!) 140/106 (!) 126/106 (!) 125/100  Pulse: 80 (!) 102 98 75  Resp: 16 14 18 14   Temp: 98.7 F (37.1 C) 98.7 F (37.1 C) 98.6 F (37 C) 98.1 F (36.7 C)  TempSrc: Oral Oral Oral Oral  SpO2: 97% 95% 98% 94%  Weight:      Height:       CBC:  Recent Labs  Lab 01/24/21 1930 01/24/21 1943 01/25/21 0240 01/26/21 0219  WBC 9.0  --  9.2 9.8  NEUTROABS 5.9  --   --   --   HGB 15.8   < > 15.0 14.8  HCT 49.1   < > 45.3 44.0  MCV 97.2  --  94.2 93.2  PLT 181  --  197 186   < > = values in this interval not displayed.   Basic Metabolic Panel:  Recent Labs  Lab 01/25/21 0240 01/26/21 0219  NA 136 135  K 4.0 3.5  CL 103 102  CO2 23 24  GLUCOSE 103* 102*  BUN 19 15  CREATININE 2.00* 1.90*  CALCIUM 8.7* 8.5*   Lipid Panel:  Recent Labs  Lab 01/25/21 0006  CHOL 94  TRIG 73  HDL 23*  CHOLHDL 4.1  VLDL 15  LDLCALC 56   HgbA1c:  Recent Labs  Lab 01/25/21 0004  HGBA1C 6.1*   Urine Drug Screen:  Recent Labs  Lab 01/25/21 0031  LABOPIA NONE DETECTED  COCAINSCRNUR NONE DETECTED  LABBENZ NONE DETECTED  AMPHETMU NONE DETECTED  THCU NONE DETECTED  LABBARB NONE DETECTED    Alcohol Level No results for input(s): ETH in the last 168 hours.  IMAGING past 24 hours ECHOCARDIOGRAM COMPLETE  Result Date: 01/25/2021    ECHOCARDIOGRAM REPORT   Patient Name:   AYIDEN CASILLAS Date of Exam: 01/25/2021 Medical Rec #:  [id number]    Height:       70.0 in Accession #:    [id number]   Weight:       282.0 lb Date of Birth:  06/12/1965    BSA:          2.415 m Patient Age:    55 years     BP:           141/98 mmHg Patient Gender: M            HR:           110 bpm. Exam Location:   Inpatient Procedure: 2D Echo, Cardiac Doppler, Color Doppler and Intracardiac            Opacification Agent Indications:    TIA  History:        Patient has no prior history of Echocardiogram examinations.                 Arrythmias:Atrial Fibrillation; Risk Factors:Hypertension.  Sonographer:    Glo Herring Referring Phys: [id number] [place]  1. There is no left ventricular thrombus seen (with Definity contrast). Left ventricular ejection fraction, by estimation, is 20 to 25%. The left ventricle has severely decreased function. The left ventricle demonstrates global hypokinesis. The left ventricular internal cavity size was moderately dilated. There is  mild eccentric left ventricular hypertrophy. Left ventricular diastolic function could not be evaluated.  2. Right ventricular systolic function is moderately reduced. The right ventricular size is normal. There is moderately elevated pulmonary artery systolic pressure.  3. Left atrial size was severely dilated.  4. Right atrial size was moderately dilated.  5. The pericardial effusion is localized near the right atrium.  6. The mitral valve is normal in structure. Mild to moderate mitral valve regurgitation.  7. The aortic valve is tricuspid. Aortic valve regurgitation is not visualized. No aortic stenosis is present.  8. There is mild dilatation of the ascending aorta, measuring 41 mm.  9. The inferior vena cava is dilated in size with >50% respiratory variability, suggesting right atrial pressure of 8 mmHg. Comparison(s): A prior study was performed on 04/18/2017 (outside report). The left ventricular function is unchanged. FINDINGS  Left Ventricle: There is no left ventricular thrombus seen (with Definity contrast). Left ventricular ejection fraction, by estimation, is 20 to 25%. The left ventricle has severely decreased function. The left ventricle demonstrates global hypokinesis.  Definity contrast agent was given IV to delineate the left  ventricular endocardial borders. The left ventricular internal cavity size was moderately dilated. There is mild eccentric left ventricular hypertrophy. Left ventricular diastolic function could  not be evaluated due to atrial fibrillation. Left ventricular diastolic function could not be evaluated. Right Ventricle: The right ventricular size is normal. No increase in right ventricular wall thickness. Right ventricular systolic function is moderately reduced. There is moderately elevated pulmonary artery systolic pressure. The tricuspid regurgitant velocity is 3.35 m/s, and with an assumed right atrial pressure of 8 mmHg, the estimated right ventricular systolic pressure is 99991111 mmHg. Left Atrium: Left atrial size was severely dilated. Right Atrium: Right atrial size was moderately dilated. Pericardium: Trivial pericardial effusion is present. The pericardial effusion is localized near the right atrium. Mitral Valve: The mitral valve is normal in structure. Mild to moderate mitral valve regurgitation, with centrally-directed jet. Tricuspid Valve: The tricuspid valve is not well visualized. Tricuspid valve regurgitation is not demonstrated. Aortic Valve: The aortic valve is tricuspid. Aortic valve regurgitation is not visualized. No aortic stenosis is present. Pulmonic Valve: The pulmonic valve was normal in structure. Pulmonic valve regurgitation is not visualized. Aorta: The aortic root is normal in size and structure. There is mild dilatation of the ascending aorta, measuring 41 mm. Venous: The inferior vena cava is dilated in size with greater than 50% respiratory variability, suggesting right atrial pressure of 8 mmHg. IAS/Shunts: No atrial level shunt detected by color flow Doppler. Additional Comments: A device lead is visualized in the right ventricle.  LEFT VENTRICLE PLAX 2D LVIDd:         6.20 cm LVIDs:         5.60 cm LV PW:         1.40 cm LV IVS:        1.40 cm LVOT diam:     2.20 cm LVOT Area:     3.80  cm  LV Volumes (MOD) LV vol d, MOD A2C: 197.0 ml LV vol d, MOD A4C: 238.0 ml LV vol s, MOD A2C: 150.0 ml LV vol s, MOD A4C: 187.0 ml LV SV MOD A2C:     47.0 ml LV SV MOD A4C:     238.0 ml LV SV MOD BP:      49.7 ml RIGHT VENTRICLE RV Basal diam:  3.90 cm RV Mid diam:    1.90 cm RV S  prime:     6.18 cm/s LEFT ATRIUM              Index        RIGHT ATRIUM           Index LA diam:        4.90 cm  2.03 cm/m   RA Area:     26.70 cm LA Vol (A2C):   113.0 ml 46.78 ml/m  RA Volume:   87.00 ml  36.02 ml/m LA Vol (A4C):   129.0 ml 53.41 ml/m LA Biplane Vol: 126.0 ml 52.16 ml/m   AORTA Ao Root diam: 3.30 cm Ao Asc diam:  4.10 cm TRICUSPID VALVE TR Peak grad:   44.9 mmHg TR Vmax:        335.00 cm/s  SHUNTS Systemic Diam: 2.20 cm Dani Gobble Croitoru MD Electronically signed by Sanda Klein MD Signature Date/Time: 01/25/2021/2:49:01 PM    Final     PHYSICAL EXAM Pleasant middle-aged male not in distress. . Afebrile. Head is nontraumatic. Neck is supple without bruit.    Cardiac exam no murmur or gallop. Lungs are clear to auscultation. Distal pulses are well felt.  Neurological Exam ;  Awake  Alert oriented x 3. Normal speech and language.eye movements full without nystagmus.fundi were not visualized. Vision acuity and fields appear normal. Hearing is normal. Palatal movements are normal. Face symmetric. Tongue midline. Normal strength, tone, reflexes and coordination. Normal sensation. Gait deferred.  ASSESSMENT/PLAN Mr. William Henderson is a 55 y.o. male with history of cardiac arrest s/p ICD, CHF, HTN, CKD 3, pAfibb on eliquis presenting with R sided weakness and fall.   Stroke: Suspect left brain subcortical infarct likely secondary due to embolism  given AF not on full dose AC Code Stroke CT head No acute abnormality. ASPECTS 10.    CTA head & neck no LVO or stenoses. Small layering B pleural effusions MRI patient unable to tolerate laying flat hence discontinued  2D Echo EF 20-25%. No source of embolus  LDL  56 HgbA1c 6.1 VTE prophylaxis - eliquis Eliquis but only taking one a day  prior to admission, now on full dose Eliquis (apixaban) daily. Continue at d/c.  Therapy recommendations:  no therapy needs Disposition:  return home   Atrial Fibrillation Home anticoagulation:  Eliquis once a day d/t insurance, now BID Continue Eliquis (apixaban) daily at discharge   Hypertension Stable Permissive hypertension (OK if < 220/120) but gradually normalize in 5-7 days Long-term BP goal normotensive  Hyperlipidemia Home meds:  crestor 20, resumed in hospital LDL 56, goal < 70  Continue statin at discharge  Other Stroke Risk Factors Morbid Obesity, Body mass index is 38.05 kg/m., BMI >/= 30 associated with increased stroke risk, recommend weight loss, diet and exercise as appropriate  Likely Obstructive sleep apnea, Supposed to have a sleep study- 12/28 appt with primary Congestive heart failure NICM S/p ICD  Other Active Problems [facility] day # 0   Patient presented with transient right-sided weakness likely due to small left subcortical infarct etiology likely embolism from atrial fibrillation on suboptimal anticoagulation.  Patient counseled to take Eliquis twice daily which is the recommended dose and maintain aggressive risk factor modification.  Check repeat Ct head scan as he is unable to complete MRI and discharged home after that.  Follow-up as an outpatient stroke clinic in 2 months.  Stroke team will sign off.  Kindly call for questions..  Greater than 50% time during this 25-minute visit was  spent in counseling and coordination of care and discussion with care team and answering questions.  Delia Heady, MD Medical Director [facility] Pager: [phone] 01/26/2021 1:16 PM   To contact Stroke Continuity provider, please refer to [email or web address]. After hours, contact General Neurology

## 2021-01-26 NOTE — Progress Notes (Signed)
Family Medicine Teaching Service Daily Progress Note Intern Pager: [phone]  Patient name: William Henderson Medical record number: [id number] Date of birth: [birth year] Age: 55 y.o. Gender: male  Primary Care Provider: Elder Negus, PA-C Consultants: Neurology Code Status: Full  Pt Overview and Major Events to Date:  12/20- admitted  Assessment and Plan: William Henderson is a 55yo male who presented with transient right-sided weakness. He has a history of cardiac arrest s/p ICD placement (2019), CVA (2017), HFmrEF, NICM, persistent A Fib with inadequate anticoagulation, HTN, CKD 3b.   Transient R-Sided Weakness   TIA vs CVA Strength is back to baseline.  Did not receive MRI yesterday due to issues with his ICD not being placed in safe med.  MRI scheduled for 1 PM today.  Echo yesterday with LVEF 20 to 25%, stable from previous scans.  No evidence of thrombus.  He does have global hypokinesis of the left ventricle, mild eccentric LVH, moderately reduced RV systolic function, severely dilated left atrium, moderately dilated right atrium, pericardial effusion near the right atrium, mild to moderate mitral valve regurg.  Per neurology note, they believe symptoms secondary to embolism from A. fib with inadequate anticoagulation.  Feels well and is eager to go home if MRI findings are benign.  -Follow-up MRI -ASA 81 mg daily -PT/OT -Neurology following, appreciate recommendations  Persistent A. fib, and adequately anticoagulated Suspect that this was the ultimate cause of his symptoms.  He had only been taking 2.5 mg of Eliquis once daily and only for 2 days. -Eliquis 5 mg twice daily -Amiodarone 200 mg daily  HFmrEF   NICM   hx V Fib arrest s/p ICD placement Patient did not receive any Lasix yesterday.  Does have crackles on left lung base with no lower extremity edema or JVP.  Per chart review it appears that he was only taking his Coreg once daily also.  Once out of permissive hypertensive window  we will restart this on a twice daily basis -Restart home Lasix this evening if patient is still here -Hold off on home Coreg in the setting of permissive hypertension  HTN BP 120-140s/100s.  - Holding lisinopril 10mg  daily, amlodipine 10mg  daily, Coreg  HLD - Atorvastatin 80mg  daily  FEN/GI: Heart healthy  PPx: On Eliquis Dispo:Home today. Barriers include MRI.   Subjective:  Ms. has no acute complaints this morning.  He is eager to get back home but expresses a strong desire to wait for the MRI to "make sure things are right" before discharging back home.  Objective: Temp:  [98.2 F (36.8 C)-98.9 F (37.2 C)] 98.7 F (37.1 C) (12/22 0421) Pulse Rate:  [80-95] 80 (12/22 0421) Resp:  [14-27] 16 (12/22 0421) BP: (121-141)/(85-107) 128/105 (12/22 0421) SpO2:  [92 %-100 %] 97 % (12/22 0421) Weight:  [120.3 kg] 120.3 kg (12/21 1504) Physical Exam: General: Well-nourished 55 year old male, NAD Cardiovascular: Irregularly irregular, normal rate, no murmur, pulses 2+ throughout, no JVD Respiratory: Bibasilar crackles left greater than right, tachypneic Abdomen: Soft, nontender, nondistended Extremities: Without edema or deformity  Laboratory: Recent Labs  Lab 01/24/21 1930 01/24/21 1943 01/25/21 0240 01/26/21 0219  WBC 9.0  --  9.2 9.8  HGB 15.8 17.0 15.0 14.8  HCT 49.1 50.0 45.3 44.0  PLT 181  --  197 186   Recent Labs  Lab 01/24/21 1930 01/24/21 1943 01/25/21 0240 01/26/21 0219  NA 138 140 136 135  K 3.6 3.4* 4.0 3.5  CL 105 106 103 102  CO2 22  --  23 24  BUN 20 21* 19 15  CREATININE 1.98* 1.90* 2.00* 1.90*  CALCIUM 9.1  --  8.7* 8.5*  PROT 7.8  --   --   --   BILITOT 1.4*  --   --   --   ALKPHOS 46  --   --   --   ALT 17  --   --   --   AST 22  --   --   --   GLUCOSE 117* 115* 103* 102*     Imaging/Diagnostic Tests: ECHOCARDIOGRAM COMPLETE    ECHOCARDIOGRAM REPORT       Patient Name:   William Henderson Date of Exam: 01/25/2021 Medical Rec #:   [id number]    Height:       70.0 in Accession #:    [id number]   Weight:       282.0 lb Date of Birth:  05-09-1965    BSA:          2.415 m Patient Age:    55 years     BP:           141/98 mmHg Patient Gender: M            HR:           110 bpm. Exam Location:  Inpatient  Procedure: 2D Echo, Cardiac Doppler, Color Doppler and Intracardiac            Opacification Agent  Indications:    TIA   History:        Patient has no prior history of Echocardiogram examinations.                 Arrythmias:Atrial Fibrillation; Risk Factors:Hypertension.   Sonographer:    Vanetta Shawl Referring Phys: [id number] MARGARET E PRAY  IMPRESSIONS   1. There is no left ventricular thrombus seen (with Definity contrast). Left ventricular ejection fraction, by estimation, is 20 to 25%. The left ventricle has severely decreased function. The left ventricle demonstrates global hypokinesis. The left  ventricular internal cavity size was moderately dilated. There is mild eccentric left ventricular hypertrophy. Left ventricular diastolic function could not be evaluated.  2. Right ventricular systolic function is moderately reduced. The right ventricular size is normal. There is moderately elevated pulmonary artery systolic pressure.  3. Left atrial size was severely dilated.  4. Right atrial size was moderately dilated.  5. The pericardial effusion is localized near the right atrium.  6. The mitral valve is normal in structure. Mild to moderate mitral valve regurgitation.  7. The aortic valve is tricuspid. Aortic valve regurgitation is not visualized. No aortic stenosis is present.  8. There is mild dilatation of the ascending aorta, measuring 41 mm.  9. The inferior vena cava is dilated in size with >50% respiratory variability, suggesting right atrial pressure of 8 mmHg.  Comparison(s): A prior study was performed on 04/18/2017 (outside report). The left ventricular function is unchanged.  FINDINGS  Left  Ventricle: There is no left ventricular thrombus seen (with Definity contrast). Left ventricular ejection fraction, by estimation, is 20 to 25%. The left ventricle has severely decreased function. The left ventricle demonstrates global hypokinesis.  Definity contrast agent was given IV to delineate the left ventricular endocardial borders. The left ventricular internal cavity size was moderately dilated. There is mild eccentric left ventricular hypertrophy. Left ventricular diastolic function could  not be evaluated due to atrial fibrillation. Left ventricular diastolic function could not be evaluated.  Right Ventricle: The right  ventricular size is normal. No increase in right ventricular wall thickness. Right ventricular systolic function is moderately reduced. There is moderately elevated pulmonary artery systolic pressure. The tricuspid regurgitant  velocity is 3.35 m/s, and with an assumed right atrial pressure of 8 mmHg, the estimated right ventricular systolic pressure is 99991111 mmHg.  Left Atrium: Left atrial size was severely dilated.  Right Atrium: Right atrial size was moderately dilated.  Pericardium: Trivial pericardial effusion is present. The pericardial effusion is localized near the right atrium.  Mitral Valve: The mitral valve is normal in structure. Mild to moderate mitral valve regurgitation, with centrally-directed jet.  Tricuspid Valve: The tricuspid valve is not well visualized. Tricuspid valve regurgitation is not demonstrated.  Aortic Valve: The aortic valve is tricuspid. Aortic valve regurgitation is not visualized. No aortic stenosis is present.  Pulmonic Valve: The pulmonic valve was normal in structure. Pulmonic valve regurgitation is not visualized.  Aorta: The aortic root is normal in size and structure. There is mild dilatation of the ascending aorta, measuring 41 mm.  Venous: The inferior vena cava is dilated in size with greater than 50% respiratory variability,  suggesting right atrial pressure of 8 mmHg.  IAS/Shunts: No atrial level shunt detected by color flow Doppler.  Additional Comments: A device lead is visualized in the right ventricle.    LEFT VENTRICLE PLAX 2D LVIDd:         6.20 cm LVIDs:         5.60 cm LV PW:         1.40 cm LV IVS:        1.40 cm LVOT diam:     2.20 cm LVOT Area:     3.80 cm   LV Volumes (MOD) LV vol d, MOD A2C: 197.0 ml LV vol d, MOD A4C: 238.0 ml LV vol s, MOD A2C: 150.0 ml LV vol s, MOD A4C: 187.0 ml LV SV MOD A2C:     47.0 ml LV SV MOD A4C:     238.0 ml LV SV MOD BP:      49.7 ml  RIGHT VENTRICLE RV Basal diam:  3.90 cm RV Mid diam:    1.90 cm RV S prime:     6.18 cm/s  LEFT ATRIUM              Index        RIGHT ATRIUM           Index LA diam:        4.90 cm  2.03 cm/m   RA Area:     26.70 cm LA Vol (A2C):   113.0 ml 46.78 ml/m  RA Volume:   87.00 ml  36.02 ml/m LA Vol (A4C):   129.0 ml 53.41 ml/m LA Biplane Vol: 126.0 ml 52.16 ml/m    AORTA Ao Root diam: 3.30 cm Ao Asc diam:  4.10 cm  TRICUSPID VALVE TR Peak grad:   44.9 mmHg TR Vmax:        335.00 cm/s   SHUNTS Systemic Diam: 2.20 cm  Dani Gobble Croitoru MD Electronically signed by Sanda Klein MD Signature Date/Time: 01/25/2021/2:49:01 PM      Final      Eppie Gibson, MD 01/26/2021, 6:42 AM PGY-1, [place] Intern pager: [phone], text pages welcome

## 2021-01-26 NOTE — Progress Notes (Signed)
MD on call notified via chat patient HR 140 he had ambulated to bathroom vitial signs charted in Epic. Ilean Skill LPN

## 2021-02-08 ENCOUNTER — Other Ambulatory Visit (HOSPITAL_COMMUNITY): Payer: Self-pay

## 2021-02-08 ENCOUNTER — Telehealth (HOSPITAL_COMMUNITY): Payer: Self-pay

## 2021-02-08 NOTE — Telephone Encounter (Signed)
Pharmacy Transitions of Care Follow-up Telephone Call  Date of discharge: 01/26/21  Discharge Diagnosis: TIA  How have you been since you were released from the hospital?  Patient is feeling much better and stronger since discharge, no questions about meds at this time.  Medication changes made at discharge:     START taking: atorvastatin (LIPITOR)  CHANGE how you take: carvedilol (COREG)  Eliquis (apixaban)  STOP taking: lisinopril 10 MG tablet (ZESTRIL)  rosuvastatin 20 MG tablet (CRESTOR)   Medication changes verified by the patient? Yes    Medication Accessibility:  Home Pharmacy: Not discussed   Was the patient provided with refills on discharged medications? No   Have all prescriptions been transferred from [facility] to home pharmacy? N/A   Is the patient able to afford medications? Has insurance Notable copays: Eliquis is $4.00/30 days    Medication Review:  APIXABAN (ELIQUIS)  Apixaban 5 mg BID initiated on 01/26/21. Was on Eliquis 2.5mg  BID - Discussed importance of taking medication around the same time everyday  - Advised patient of medications to avoid (NSAIDs, ASA)  - Educated that Tylenol (acetaminophen) will be the preferred analgesic to prevent risk of bleeding  - Emphasized importance of monitoring for signs and symptoms of bleeding (abnormal bruising, prolonged bleeding, nose bleeds, bleeding from gums, discolored urine, black tarry stools)  - Advised patient to alert all providers of anticoagulation therapy prior to starting a new medication or having a procedure   Follow-up Appointments:  PCP Hospital f/u appt confirmed? Scheduled to see Dr. Allyne Gee on 02/09/21   If their condition worsens, is the pt aware to call PCP or go to the Emergency Dept.? yes  Final Patient Assessment: Patient has f/u scheduled and knows to get refills at f/u

## 2021-03-07 ENCOUNTER — Other Ambulatory Visit: Payer: Self-pay

## 2021-03-07 ENCOUNTER — Ambulatory Visit: Payer: Medicare Other | Admitting: Physical Therapy

## 2021-03-07 ENCOUNTER — Encounter: Payer: Self-pay | Admitting: Occupational Therapy

## 2021-03-07 ENCOUNTER — Encounter: Payer: Self-pay | Admitting: Physical Therapy

## 2021-03-07 ENCOUNTER — Ambulatory Visit: Payer: Medicare Other | Attending: Physician Assistant | Admitting: Occupational Therapy

## 2021-03-07 ENCOUNTER — Ambulatory Visit: Payer: Medicare Other

## 2021-03-07 VITALS — BP 126/91 | HR 65

## 2021-03-07 DIAGNOSIS — M6281 Muscle weakness (generalized): Secondary | ICD-10-CM | POA: Diagnosis present

## 2021-03-07 DIAGNOSIS — R2689 Other abnormalities of gait and mobility: Secondary | ICD-10-CM

## 2021-03-07 DIAGNOSIS — R2681 Unsteadiness on feet: Secondary | ICD-10-CM | POA: Diagnosis present

## 2021-03-07 DIAGNOSIS — R471 Dysarthria and anarthria: Secondary | ICD-10-CM | POA: Insufficient documentation

## 2021-03-07 NOTE — Therapy (Signed)
[facility] [address] [place], [place], [address] Phone: [phone]   Fax:  [phone]  Speech Language Pathology Evaluation  Patient Details  Name: William Henderson MRN: [id number] Date of Birth: Nov 05, 1965 Referring Provider (SLP): Candi Leash, PA-C   Encounter Date: 03/07/2021   End of Session - 03/07/21 1440     Visit Number 1    Number of Visits 1    Date for SLP Re-Evaluation 03/07/21    SLP Start Time 1152    SLP Stop Time  1216    SLP Time Calculation (min) 24 min             Past Medical History:  Diagnosis Date   Cardiac arrest ([place])    CHF (congestive heart failure) ([place])    Hypertension    Nonischemic cardiomyopathy ([place])    Paroxysmal atrial fibrillation ([place])    Renal disorder     Past Surgical History:  Procedure Laterality Date   CARDIAC DEFIBRILLATOR PLACEMENT      There were no vitals filed for this visit.   Subjective Assessment - 03/07/21 1435     Subjective Pt reports he is back at baseline with speech clarity/intelligiblity.    Currently in Pain? No/denies                SLP Evaluation [facility] - 03/07/21 1435       SLP Visit Information   SLP Received On 03/07/21    Referring Provider (SLP) Candi Leash, PA-C    Onset Date approx 01-22-21    Medical Diagnosis Slurred speech      General Information   HPI Pt presented to ED on 01-24-21 after several days of worsening orthopnea and DOE. 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      Balance Screen   Has the patient fallen in the past 6 months Yes    How many times? 1    Has the patient had a decrease in activity level because of a fear of falling?  No    Is the patient reluctant to leave their home because of a fear of falling?  No      Prior Functional Status    Cognitive/Linguistic Baseline Within functional limits    Type of Home House    Vocation --   currently unable to work     Cognition   Overall Cognitive Status Within Functional Limits for tasks assessed      Auditory Comprehension   Overall Auditory Comprehension Appears within functional limits for tasks assessed      Verbal Expression   Overall Verbal Expression Appears within functional limits for tasks assessed      Oral Motor/Sensory Function   Overall Oral Motor/Sensory Function Appears within functional limits for tasks assessed      Motor Speech   Overall Motor Speech Appears within functional limits for tasks assessed    Respiration Within functional limits    Articulation Within functional limitis    Intelligibility Intelligible    Motor Planning Witnin functional limits                             SLP Education - 03/07/21 1439     Education Details pt does not appear to have any oral motor weakness that would lead  to dysarthria at this time, no skilled ST necessary    Person(s) Educated Patient    Methods Explanation    Comprehension Verbalized understanding                  Plan - 03/07/21 1440     Clinical Impression Statement William Henderson presents today with oral motor strength resolved and no discernable weakness or slurring in his speech in 20 minutes of mod complex conversation with both familiar and other topics exhibiting heightened excitement/emotion. At this time skilled ST does not appear to be warranted due to pt's speech WNL, and according to pt speech clarity/intelligibility having returning to premorbid level.    Speech Therapy Frequency One time visit    Consulted and Agree with Plan of Care Patient             Patient will benefit from skilled therapeutic intervention in order to improve the following deficits and impairments:   Dysarthria and anarthria    Problem List Patient Active Problem List   Diagnosis Date  Noted   TIA (transient ischemic attack) 01/25/2021   AKI (acute kidney injury) ([place]) 01/25/2021   Paroxysmal atrial fibrillation ([place]) 01/25/2021   Hypertension 01/25/2021   Hyperlipidemia LDL goal <70 01/25/2021    [place], [place] 03/07/2021, 2:43 PM  [facility] [address] [place], [place], [address] Phone: [phone]   Fax:  [phone]  Name: William Henderson MRN: [id number] Date of Birth: 03/21/1965

## 2021-03-07 NOTE — Therapy (Signed)
[facility] [address] [place], [place], [address] Phone: [phone]   Fax:  [phone]  Occupational Therapy Evaluation  Patient Details  Name: William Henderson MRN: [id number] Date of Birth: Apr 27, 1965 Referring Provider (OT): Candi Leash, PA-C   Encounter Date: 03/07/2021   OT End of Session - 03/07/21 1232     Visit Number 1    Number of Visits 5    Date for OT Re-Evaluation 04/07/21    Authorization Type Medicare A and B, Medicaid secondary    OT Start Time 1017    OT Stop Time 1104    OT Time Calculation (min) 47 min    Activity Tolerance Patient tolerated treatment well   reports SOB occasionally but did not impact participation at eval   Behavior During Therapy [facility] for tasks assessed/performed             Past Medical History:  Diagnosis Date   Cardiac arrest ([place])    CHF (congestive heart failure) ([place])    Hypertension    Nonischemic cardiomyopathy ([place])    Paroxysmal atrial fibrillation ([place])    Renal disorder     Past Surgical History:  Procedure Laterality Date   CARDIAC DEFIBRILLATOR PLACEMENT      There were no vitals filed for this visit.   Subjective Assessment - 03/07/21 1024     Subjective  Pt reports every day activities are difficult.  Pt reports he fatigues quickly and sometimes with SOB and lightheadedness during functional tasks.    Pertinent History cardiac arrest, CHF, HTN, afib, ICD placement    Limitations endurance    Currently in Pain? No/denies               [facility] OT Assessment - 03/07/21 1026       Assessment   Medical Diagnosis Cerebral infarction, unspecified    Referring Provider (OT) Candi Leash, PA-C    Onset Date/Surgical Date 02/21/21    Hand Dominance Right    Next MD Visit 04/24/21   hospital f/u, have encouraged pt to reach out to cardiologist   Prior Therapy in hospital      Precautions   Precautions [place]   Has the patient  fallen in the past 6 months Yes    How many times? 1    Has the patient had a decrease in activity level because of a fear of falling?  No    Is the patient reluctant to leave their home because of a fear of falling?  No      Home  Environment   Family/patient expects to be discharged to: Private residence    Living Arrangements Spouse/significant other   58 yo son   Available Help at Discharge Available PRN/intermittently    Type of [place]   7+8 has B rails, but only can reach one at a time   Home Layout One level    Bathroom Shower/Tub Tub/Shower unit   currently placed a "lawn chair" in [place] With Spouse;Son      Prior Function   Level of Independence Independent    Vocation --   stage performer with [employer]   Leisure reading, word games, playing drums, playing with 83 yo son, riding motorcycle      ADL   ADL comments Pt reports distant supervision to Mod I with  bathing/dressing at shower level.  Reports he has placed a "lawn chair" in shower for endurance which he uses occasionally, but has been able to complete entire shower standing with minimal decrease in endurance      IADL   Medication Management Is responsible for taking medication in correct dosages at correct time    Financial Management Manages financial matters independently (budgets, writes checks, pays rent, bills goes to bank), collects and keeps track of income      Written Expression   Dominant Hand Right    Handwriting 100% legible      Vision - History   Baseline Vision Wears glasses only for reading      Activity Tolerance   Activity Tolerance Tolerates 10-20 min activity with multiple rests   per report     Sensation   Light Touch Appears Intact    Proprioception Appears Intact      Coordination   9 Hole Peg Test Right;Left    Right 9 Hole Peg Test 24.65    Left 9 Hole Peg Test 28.41    Box and Blocks R: 48 and L: 49      ROM /  Strength   AROM / PROM / Strength AROM;Strength      AROM   Overall AROM  Within functional limits for tasks performed   BUE [facility]     Strength   Overall Strength Within functional limits for tasks performed   BUE WFL     Hand Function   Right Hand Grip (lbs) 85    Left Hand Grip (lbs) 85                              OT Education - 03/07/21 1232     Education Details educated on possibility of cardiac rehab for increased endurance    Person(s) Educated Patient    Methods Explanation    Comprehension Verbalized understanding                 OT Long Term Goals - 03/07/21 1241       OT LONG TERM GOAL #1   Title Pt will report able to complete entire shower without rest break    Time 4    Period Weeks    Status New    Target Date 04/07/21      OT LONG TERM GOAL #2   Title Pt will complete 30 mins moderate intensity activity with <2 rest breaks    Time 4    Period Weeks    Status New      OT LONG TERM GOAL #3   Title Pt will explore options for DME for safe performance of shower.    Time 4    Period Weeks    Status New      OT LONG TERM GOAL #4   Title Pt will be independent in completing a full body/routine HEP    Time 4    Period Weeks    Status New                   Plan - 03/07/21 1234     Clinical Impression Statement Pt is a 56 y/o male who presents to OP OT due to decreased endurance, onset of SOB and lightheadedness when completing ADLs, IADLs, and leisure pursuits.. 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 PMHx includes cardiac arrest, CHF, HTN, afib, ICD placement. Pt will benefit from skilled occupational therapy services to address balance, safety awareness, introduction of compensatory strategies/AE prn, and implementation of an HEP to improve participation and safety during ADLs, IADLs, and leisure pursuits.     OT Occupational Profile and History Problem Focused Assessment - Including review of records relating to presenting problem    Occupational performance deficits (Please refer to evaluation for details): ADL's;IADL's;Work;Leisure;Social Participation    Body Structure / Function / Physical Skills ADL;Balance;Cardiopulmonary status limiting activity;Decreased knowledge of precautions;Decreased knowledge of use of DME;Endurance;FMC;GMC;IADL;Mobility;Pain;Strength;UE functional use    Rehab Potential Fair    Clinical Decision Making Limited treatment options, no task modification necessary    Comorbidities Affecting Occupational Performance: May have comorbidities impacting occupational performance    Modification or Assistance to Complete Evaluation  No modification of tasks or assist necessary to complete eval    OT Frequency 1x / week    OT Duration 4 weeks    OT Treatment/Interventions Self-care/ADL training;Aquatic Therapy;Ultrasound;Therapeutic exercise;Energy conservation;DME and/or AE instruction;Functional Mobility Training;Manual Therapy;Therapeutic activities;Patient/family education;Balance training    Plan establish a HEP for BUE strengthening and endurance    Recommended Other Services cardiac rehab    Consulted and Agree with Plan of Care Patient             Patient will benefit from skilled therapeutic intervention in order to improve the following deficits and impairments:   Body Structure / Function / Physical Skills: ADL, Balance, Cardiopulmonary status limiting activity, Decreased knowledge of precautions, Decreased knowledge of use of DME, Endurance, FMC, GMC, IADL, Mobility, Pain, Strength, UE functional use       Visit Diagnosis: Muscle weakness (generalized)  Unsteadiness on feet    Problem List Patient Active Problem List   Diagnosis Date Noted   TIA (transient ischemic attack) 01/25/2021   AKI (acute kidney injury) ([place]) 01/25/2021   Paroxysmal atrial  fibrillation ([place]) 01/25/2021   Hypertension 01/25/2021   Hyperlipidemia LDL goal <70 01/25/2021    Simonne Come, OT 03/07/2021, 12:43 PM  [facility] [address], [place], [place], [address] Phone: [phone]   Fax:  [phone]  Name: William Henderson MRN: [id number] Date of Birth: 03-Jul-1965

## 2021-03-07 NOTE — Therapy (Signed)
[facility] [address] [place], [place], [address] Phone: [phone]   Fax:  [phone]  Physical Therapy Evaluation  Patient Details  Name: William Henderson MRN: [id number] Date of Birth: 1965/12/07 Referring Provider (PT): Elder Negus, PA-C   Encounter Date: 03/07/2021   PT End of Session - 03/07/21 1107     Visit Number 1    Number of Visits 17    Date for PT Re-Evaluation 05/05/21    Authorization Type Medicare/Medicaid    Progress Note Due on Visit 10    PT Start Time 1105    PT Stop Time 1148    PT Time Calculation (min) 43 min    Activity Tolerance Patient limited by fatigue    Behavior During Therapy [facility] for tasks assessed/performed             Past Medical History:  Diagnosis Date   Cardiac arrest ([facility])    CHF (congestive heart failure) ([facility])    Hypertension    Nonischemic cardiomyopathy ([facility])    Paroxysmal atrial fibrillation ([facility])    Renal disorder     Past Surgical History:  Procedure Laterality Date   CARDIAC DEFIBRILLATOR PLACEMENT      Vitals:   03/07/21 1124  BP: (!) 126/91  Pulse: 65  SpO2: 96%      Subjective Assessment - 03/07/21 1107     Subjective Walking can be pretty strenuous, but sometimes longer distances make me fatigued.  No falls.  More weakness, period, since the TIA event in December 2022.  Get winded very easily    Pertinent History PMH:  v fib arrest s/p ICD placement 2019, CVA 2017, a-fib, HTN, CKD    Patient Stated Goals 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    Currently in Pain? No/denies                [facility] PT Assessment - 03/07/21 1116       Assessment   Medical Diagnosis CVA    Referring Provider (PT) Elder Negus, PA-C    Onset Date/Surgical Date 02/21/21    Hand Dominance Right    Next MD Visit 04/24/21    Prior Therapy in hospital      Precautions   Precautions Fall      Balance Screen    Has the patient fallen in the past 6 months No    Has the patient had a decrease in activity level because of a fear of falling?  No    Is the patient reluctant to leave their home because of a fear of falling?  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[employer]    Leisure reading, word games, playing drums, playing with 56 yo son, riding motorcycle      Observation/Other Assessments   Focus on Therapeutic Outcomes (FOTO)  NA      ROM / Strength   AROM / PROM / Strength Strength;AROM      AROM   Overall AROM  Deficits      Strength   Overall Strength Within functional limits for tasks performed      Transfers   Transfers Sit to Stand;Stand to Sit    Sit to Stand 5: Supervision;Without upper extremity assist;From chair/3-in-1    Five time sit to stand comments  22.22    Stand  to Sit 5: Supervision;Without upper extremity assist;To chair/3-in-1    Comments Pt reports lightheadedness with repeated sit to stand      Ambulation/Gait   Ambulation/Gait Yes    Ambulation/Gait Assistance 6: Modified independent (Device/Increase time)    Ambulation Distance (Feet) 60 Feet   x 2   Assistive device None    Gait velocity 21.78 sec =1.51 ft/sec      Standardized Balance Assessment   Standardized Balance Assessment Timed Up and Go Test      Timed Up and Go Test   Normal TUG (seconds) 21.06    TUG Comments Scores >13.5 sec indicate increased fall risk.                        Objective measurements completed on examination: See above findings.            Self care: Initiated HEP:  seated march, LAQ, ankle pumps, 2 minutes walking in home, 3x/day (based on 1-2 min of walking in eval)    PT Education - 03/07/21 1341     Education Details PT eval results, POC; initiated HEP    Person(s) Educated Patient    Methods Explanation;Demonstration;Handout    Comprehension Verbalized  understanding;Returned demonstration              PT Short Term Goals - 03/07/21 1352       PT SHORT TERM GOAL #1   Title Pt will be independent with HEP for improved strength, balance, transfers, and gait.  TARGET 04/07/2021    Time 5    Period Weeks    Status New      PT SHORT TERM GOAL #2   Title Pt will improve 5x sit<>stand to less than or equal to 16 sec to demonstrate improved functional strength and transfer efficiency.    Baseline 22.22 sec    Time 5    Period Weeks    Status New      PT SHORT TERM GOAL #3   Title Pt will improve TUG score to less than or equal to 16 sec for decreased fall risk.    Baseline 21.06 sec    Time 5    Period Weeks    Status New      PT SHORT TERM GOAL #4   Title Pt will verbalize understanding of fall prevention in home environment.    Time 5    Period Weeks    Status New               PT Long Term Goals - 03/07/21 1354       PT LONG TERM GOAL #1   Title Pt will be independent with progression of HEP for improved strength, balance, transfers, and gait.  TARGET 05/05/2021    Time 9    Period Weeks    Status New      PT LONG TERM GOAL #2   Title Pt will improve 5x sit<>stand to less than or equal to 11.5 sec to demonstrate improved functional strength and transfer efficiency.    Time 9    Period Weeks    Status New      PT LONG TERM GOAL #3   Title Pt will improve TUG score to less than or equal to 13.5 sec for decreased fall risk.    Time 9    Period Weeks    Status New      PT LONG TERM GOAL #4  Title Pt will improve gait velocity to at least 2.3 ft/sec for improved gait efficiency and safety.    Baseline 1.5 ft/sec    Time 9    Period Weeks    Status New      PT LONG TERM GOAL #5   Title Further Gait and balance testing (FGA/DGI and/or 6MWT) to be assessed/goal written as appropriate.    Time 9    Period Weeks    Status New                    Plan - 03/07/21 1345     Clinical Impression  Statement Pt is a 56 year old male with cardiac hsitory of v-fib arrest s/p ICD placement in 2019, chronic a-fib, s/p CVA December 2022.  Pt was hospitalized 12/20-12-22/22, and notes no specific weakness on one side, but generalized weakness and decreased endurance with return to daily activities.  He presents to OPPT with decreased functional strength, decreased balance, decreased gait velocity; per TUG, FTSTS, and gait velocity, pt is at fall risk.  However, pt does report lightheadedness with repeated sit to stand, and that he is ambulating in guarded, slowed pace due to fatigue, decreased endurance.  With cardiac history, PT (and OT) recommended pt continue to follow-up with cardiologist and perhaps discuss potential for cardiac rehab in the future.  At this time, pt would benefit from skilled PT to address the above stated deficits to improve overall functional mobility and decreased fall risk.    Personal Factors and Comorbidities Comorbidity 3+    Comorbidities PMH:  v fib arrest s/p ICD placement 2019, CVA 2017, a-fib, HTN, CKD    Examination-Activity Limitations Locomotion Level;Transfers;Stairs;Stand    Examination-Participation Restrictions Occupation;Community Activity;Driving    Stability/Clinical Decision Making Evolving/Moderate complexity    Clinical Decision Making Moderate    Rehab Potential Good    PT Frequency 2x / week    PT Duration 8 weeks   plus eval= 9 weeks POC   PT Treatment/Interventions ADLs/Self Care Home Management;Gait training;Stair training;Functional mobility training;Therapeutic activities;Therapeutic exercise;Balance training;Neuromuscular re-education;Patient/family education    PT Next Visit Plan Review initial HEP, continue to progress HEP for functional strength, gait training, balance.  May need to further assess DGI/FGA (unable to do at eval due to time constraints) or 6 MWT and set goals as appropriate.  Monitor vitals with activity in session.    Recommended  Other Services Pt may benefit from cardiac rehab    Consulted and Agree with Plan of Care Patient             Patient will benefit from skilled therapeutic intervention in order to improve the following deficits and impairments:  Abnormal gait, Difficulty walking, Decreased balance, Decreased activity tolerance, Decreased endurance, Decreased mobility, Decreased strength  Visit Diagnosis: Unsteadiness on feet  Muscle weakness (generalized)  Other abnormalities of gait and mobility     Problem List Patient Active Problem List   Diagnosis Date Noted   TIA (transient ischemic attack) 01/25/2021   AKI (acute kidney injury) ([place]) 01/25/2021   Paroxysmal atrial fibrillation ([place]) 01/25/2021   Hypertension 01/25/2021   Hyperlipidemia LDL goal <70 01/25/2021    Jarrin Staley W., PT 03/07/2021, 1:57 PM  [facility] [address] [place], [place], [address] Phone: [phone]   Fax:  [phone]  Name: Rolin Kochanowski MRN: [id number] Date of Birth: Oct 27, 1965

## 2021-03-07 NOTE — Patient Instructions (Signed)
Seated exercises:  1)  marching in place x 10 reps  2)  Seated leg kicks x 10 reps  3)  Heel/toe raises x 10 reps   Walking in your home:  Walk in your home, along a hallway or loop of rooms, for 2 minutes, 3 times each day.

## 2021-03-13 ENCOUNTER — Telehealth: Payer: Self-pay | Admitting: Occupational Therapy

## 2021-03-13 NOTE — Telephone Encounter (Signed)
Hi William Henderson,  William Henderson is being treated by occupational therapy for decreased endurance s/p CVA.  Pt reports every day activities are difficult.  Pt reports he fatigues quickly and sometimes with SOB and lightheadedness during functional tasks. He did not have any true weakness s/p CVA on evaluation for OT.  I am curious if it may be more cadiac in nature.  He did mention that he had been given limitations in regards to use of weights and increased endurance activities.  I did pick him up for OT to work on endurance and would like to incorporate resistance, weights, and increased intensity as able.  Please let me know if he has any specific limitations.  He may also recommend for Cardiac rehab when he is more stable from an OT/PT standpoint.   Thank you, Simonne Come, OTR/L   [facility] [address] [place], [place]  [address] Phone:  [phone] Fax:  [phone]

## 2021-03-14 ENCOUNTER — Ambulatory Visit: Payer: Medicare Other | Attending: Physician Assistant | Admitting: Occupational Therapy

## 2021-03-14 ENCOUNTER — Other Ambulatory Visit: Payer: Self-pay

## 2021-03-14 ENCOUNTER — Ambulatory Visit: Payer: Medicare Other

## 2021-03-14 ENCOUNTER — Encounter: Payer: Self-pay | Admitting: Occupational Therapy

## 2021-03-14 VITALS — BP 116/88 | HR 61

## 2021-03-14 DIAGNOSIS — M6281 Muscle weakness (generalized): Secondary | ICD-10-CM

## 2021-03-14 DIAGNOSIS — R2681 Unsteadiness on feet: Secondary | ICD-10-CM

## 2021-03-14 DIAGNOSIS — R2689 Other abnormalities of gait and mobility: Secondary | ICD-10-CM | POA: Insufficient documentation

## 2021-03-14 NOTE — Patient Instructions (Signed)
Access Code: [facility] URL: [email or web address] Date: 03/14/2021 Prepared by: [facility]  Patient Education Understanding Energy Conservation

## 2021-03-14 NOTE — Therapy (Signed)
[facility] [address], [place], [place], [address] Phone: [phone]   Fax:  [phone]  Occupational Therapy Treatment  Patient Details  Name: William Henderson MRN: [id number] Date of Birth: 11-15-1965 Referring Provider (OT): Candi Leash, PA-C   Encounter Date: 03/14/2021   OT End of Session - 03/14/21 1109     Visit Number 2    Number of Visits 5    Date for OT Re-Evaluation 04/07/21    Authorization Type Medicare A and B, Medicaid secondary    OT Start Time 1106    OT Stop Time 1148    OT Time Calculation (min) 42 min    Activity Tolerance Patient tolerated treatment well   reports SOB occasionally but did not impact participation   Behavior During Therapy WFL for tasks assessed/performed             Past Medical History:  Diagnosis Date   Cardiac arrest ([place])    CHF (congestive heart failure) ([place])    Hypertension    Nonischemic cardiomyopathy ([place])    Paroxysmal atrial fibrillation ([place])    Renal disorder     Past Surgical History:  Procedure Laterality Date   CARDIAC DEFIBRILLATOR PLACEMENT      There were no vitals filed for this visit.   Subjective Assessment - 03/14/21 1108     Subjective  Pt reports cardiologist had restricted his overhead lifting/weights.  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    Pertinent History cardiac arrest, CHF, HTN, afib, ICD placement    Limitations endurance    Currently in Pain? No/denies              Dynamic balance/endurance: Utilized sequential targets on floor to challenge weight shifting and balance reactions to stimulus with focus on endurance.  Pt reports mild fatigue with tasks.  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  Pt reports increased fatigue post 3rd attempt.   Requiring longer rest break.  HR increasing ~10 bpm, decreasing after 1-2 min rest break each time.  Increased challenge to engage in bending to retreive items from floor as pt reports increased lightheadedness after assisting wife in home with variety of Henderson keeping tasks yesterday.  Engaged in discussion about various tasks incorporating bending and reaching and strategies to increase safety with tasks.  Pt reports mild increased lightheadedness after bending and retrieving task.                      OT Education - 03/14/21 1508     Education Details Educated on energy conservation strategies.  Provided with handout with 4 P's    Person(s) Educated Patient    Methods Explanation;Handout    Comprehension Verbalized understanding                 OT Long Term Goals - 03/14/21 1452       OT LONG TERM GOAL #1   Title Pt will report able to complete entire shower without rest break    Time 4    Period Weeks    Status On-going    Target Date 04/07/21      OT LONG TERM GOAL #2   Title Pt will complete 30 mins moderate intensity activity with <2 rest breaks  Time 4    Period Weeks    Status On-going      OT LONG TERM GOAL #3   Title Pt will explore options for DME for safe performance of shower.    Time 4    Period Weeks    Status On-going      OT LONG TERM GOAL #4   Title Pt will be independent in completing a full body/routine HEP    Time 4    Period Weeks    Status On-going                   Plan - 03/14/21 1453     Clinical Impression Statement Pt responded well to increased endurance challenge.  Pt tolerating 2-3 mins moderate intensity standing task with minimal increase in HR and no reports of lightheadedness.  Therapist did challenge pt with bending and retrieving cones from from to place on table.  Pt reports mild lightheadedness with bending to retrieve items, reports subsiding quickly with rest.  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  Pt responsive to education on 4 P's of energy conservation, encouraging planning and pacing of tasks to not gradually build up tolerance.    OT Occupational Profile and History Problem Focused Assessment - Including review of records relating to presenting problem    Occupational performance deficits (Please refer to evaluation for details): ADL's;IADL's;Work;Leisure;Social Participation    Body Structure / Function / Physical Skills ADL;Balance;Cardiopulmonary status limiting activity;Decreased knowledge of precautions;Decreased knowledge of use of DME;Endurance;FMC;GMC;IADL;Mobility;Pain;Strength;UE functional use    Rehab Potential Fair    Clinical Decision Making Limited treatment options, no task modification necessary    Comorbidities Affecting Occupational Performance: May have comorbidities impacting occupational performance    Modification or Assistance to Complete Evaluation  No modification of tasks or assist necessary to complete eval    OT Frequency 1x / week    OT Duration 4 weeks    OT Treatment/Interventions Self-care/ADL training;Aquatic Therapy;Ultrasound;Therapeutic exercise;Energy conservation;DME and/or AE instruction;Functional Mobility Training;Manual Therapy;Therapeutic activities;Patient/family education;Balance training    Plan establish a HEP for BUE strengthening and endurance, dynamic balance and endurance    Recommended Other Services cardiac rehab    Consulted and Agree with Plan of Care Patient             Patient will benefit from skilled therapeutic intervention in order to improve the following deficits and impairments:   Body Structure / Function / Physical Skills: ADL, Balance, Cardiopulmonary status limiting activity, Decreased knowledge of precautions, Decreased knowledge of use of DME, Endurance, FMC, GMC, IADL, Mobility, Pain, Strength, UE  functional use       Visit Diagnosis: Muscle weakness (generalized)  Unsteadiness on feet  Other abnormalities of gait and mobility    Problem List Patient Active Problem List   Diagnosis Date Noted   TIA (transient ischemic attack) 01/25/2021   AKI (acute kidney injury) ([place]) 01/25/2021   Paroxysmal atrial fibrillation ([place]) 01/25/2021   Hypertension 01/25/2021   Hyperlipidemia LDL goal <70 01/25/2021    Simonne Come, OT 03/14/2021, 3:08 PM  [facility] [address] [place], [place], [address] Phone: [phone]   Fax:  [phone]  Name: William Henderson MRN: [id number] Date of Birth: 1965/06/22

## 2021-03-14 NOTE — Patient Instructions (Signed)
[id number]

## 2021-03-14 NOTE — Therapy (Signed)
[facility] [place], [place], [place], [address] Phone: [phone]   Fax:  [phone]  Physical Therapy Treatment  Patient Details  Name: William Henderson MRN: [id number] Date of Birth: [birth year] Referring Provider (PT): Candi Leash, PA-C   Encounter Date: 03/14/2021   PT End of Session - 03/14/21 1157     Visit Number 2    Number of Visits 17    Date for PT Re-Evaluation 05/05/21    Authorization Type Medicare/Medicaid    Progress Note Due on Visit 10    PT Start Time 1145    PT Stop Time 1230    PT Time Calculation (min) 45 min    Activity Tolerance Patient limited by fatigue    Behavior During Therapy [facility] for tasks assessed/performed             Past Medical History:  Diagnosis Date   Cardiac arrest ([place])    CHF (congestive heart failure) ([place])    Hypertension    Nonischemic cardiomyopathy ([place])    Paroxysmal atrial fibrillation ([place])    Renal disorder     Past Surgical History:  Procedure Laterality Date   CARDIAC DEFIBRILLATOR PLACEMENT      Vitals:   03/14/21 1206  BP: 116/88  Pulse: 61     Subjective Assessment - 03/14/21 1150     Subjective Continues to note fatigue with activity and with walking requiring frequent rest periods during activities and walking    Pertinent History PMH:  v fib arrest s/p ICD placement 2019, CVA 2017, a-fib, HTN, CKD    Patient Stated Goals 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                               [facility] Adult PT Treatment/Exercise - 03/14/21 0001       Ambulation/Gait   Ambulation/Gait Yes    Ambulation/Gait Assistance 7: Independent    Ambulation Distance (Feet) 250 Feet    Assistive device None    Gait Pattern Within Functional Limits    Ambulation Surface Level;Indoor    Gait Comments 2MWT with RPE 12/20 Borg      Exercises   Exercises Knee/Hip      Knee/Hip  Exercises: Standing   Lateral Step Up Both;2 sets;10 reps;Step Height: 6";Hand Hold: 2    Forward Step Up Both;Step Height: 6";Hand Hold: 1    Forward Step Up Limitations 2 min step-up, 13/20 RPE    Other Standing Knee Exercises shadow boxing 2x2 min      Knee/Hip Exercises: Seated   Long Arc Quad Strengthening;Both;3 sets;10 reps    Other Seated Knee/Hip Exercises AP 3x10                     PT Education - 03/14/21 1231     Education Details HEP additions              PT Short Term Goals - 03/07/21 1352       PT SHORT TERM GOAL #1   Title Pt will be independent with HEP for improved strength, balance, transfers, and gait.  TARGET 04/07/2021    Time 5    Period Weeks    Status New      PT SHORT TERM GOAL #2   Title Pt will improve 5x sit<>stand to less than or equal  to 16 sec to demonstrate improved functional strength and transfer efficiency.    Baseline 22.22 sec    Time 5    Period Weeks    Status New      PT SHORT TERM GOAL #3   Title Pt will improve TUG score to less than or equal to 16 sec for decreased fall risk.    Baseline 21.06 sec    Time 5    Period Weeks    Status New      PT SHORT TERM GOAL #4   Title Pt will verbalize understanding of fall prevention in home environment.    Time 5    Period Weeks    Status New               PT Long Term Goals - 03/07/21 1354       PT LONG TERM GOAL #1   Title Pt will be independent with progression of HEP for improved strength, balance, transfers, and gait.  TARGET 05/05/2021    Time 9    Period Weeks    Status New      PT LONG TERM GOAL #2   Title Pt will improve 5x sit<>stand to less than or equal to 11.5 sec to demonstrate improved functional strength and transfer efficiency.    Time 9    Period Weeks    Status New      PT LONG TERM GOAL #3   Title Pt will improve TUG score to less than or equal to 13.5 sec for decreased fall risk.    Time 9    Period Weeks    Status New      PT  LONG TERM GOAL #4   Title Pt will improve gait velocity to at least 2.3 ft/sec for improved gait efficiency and safety.    Baseline 1.5 ft/sec    Time 9    Period Weeks    Status New      PT LONG TERM GOAL #5   Title Further Gait and balance testing (FGA/DGI and/or 6MWT) to be assessed/goal written as appropriate.    Time 9    Period Weeks    Status New                   Plan - 03/14/21 1232     Clinical Impression Statement Overall, tolerating session quite well rating ambulation during 2MWT and 2 Minute step test as 11-12/20 Borg RPE without SOB/DOE noted.  Tolerated LE strengthening exercises witout adverse effect and demo BP 118/66 mmHg, 61 bpm following seated/standing activities. Boxing activity for dynamic, sustained activity tolerating 2 min well followed by 1 min rest period then attempted 2nd bout with onset of feeling lightheaded after 60 sec and demo BP of 122/100 mmHg after this requiring seated rest period. Continued sessions indicated to improve endurance/activity tolerance and gait speed    Personal Factors and Comorbidities Comorbidity 3+    Comorbidities PMH:  v fib arrest s/p ICD placement 2019, CVA 2017, a-fib, HTN, CKD    Examination-Activity Limitations Locomotion Level;Transfers;Stairs;Stand    Examination-Participation Restrictions Occupation;Community Activity;Driving    Stability/Clinical Decision Making Evolving/Moderate complexity    Rehab Potential Good    PT Frequency 2x / week    PT Duration 8 weeks   plus eval= 9 weeks POC   PT Treatment/Interventions ADLs/Self Care Home Management;Gait training;Stair training;Functional mobility training;Therapeutic activities;Therapeutic exercise;Balance training;Neuromuscular re-education;Patient/family education    PT Next Visit Plan Review initial HEP, continue to  progress HEP for functional strength, gait training, balance.  May need to further assess DGI/FGA (unable to do at eval due to time constraints) or 6  MWT and set goals as appropriate.  Monitor vitals with activity in session.    PT Home Exercise Plan step up, lateral step ups, shadow boxing    Consulted and Agree with Plan of Care Patient             Patient will benefit from skilled therapeutic intervention in order to improve the following deficits and impairments:  Abnormal gait, Difficulty walking, Decreased balance, Decreased activity tolerance, Decreased endurance, Decreased mobility, Decreased strength  Visit Diagnosis: Unsteadiness on feet  Muscle weakness (generalized)  Other abnormalities of gait and mobility     Problem List Patient Active Problem List   Diagnosis Date Noted   TIA (transient ischemic attack) 01/25/2021   AKI (acute kidney injury) ([place]) 01/25/2021   Paroxysmal atrial fibrillation ([place]) 01/25/2021   Hypertension 01/25/2021   Hyperlipidemia LDL goal <70 01/25/2021    Toniann Fail, PT 03/14/2021, 12:35 PM  [facility] [address] [place], [place], [address] Phone: [phone]   Fax:  [phone]  Name: William Henderson MRN: [id number] Date of Birth: 03-Mar-1965

## 2021-03-16 ENCOUNTER — Other Ambulatory Visit: Payer: Self-pay

## 2021-03-16 ENCOUNTER — Ambulatory Visit: Payer: Medicare Other

## 2021-03-16 VITALS — BP 138/97

## 2021-03-16 DIAGNOSIS — R2689 Other abnormalities of gait and mobility: Secondary | ICD-10-CM

## 2021-03-16 DIAGNOSIS — M6281 Muscle weakness (generalized): Secondary | ICD-10-CM

## 2021-03-16 DIAGNOSIS — R2681 Unsteadiness on feet: Secondary | ICD-10-CM

## 2021-03-16 NOTE — Therapy (Signed)
[facility] [address], [name], [place], [address] Phone: [phone]   Fax:  [phone]  Physical Therapy Treatment  Patient Details  Name: William Henderson MRN: [id number] Date of Birth: [birth year] Referring Provider (PT): Candi Leash, PA-C   Encounter Date: 03/16/2021   PT End of Session - 03/16/21 1013     Visit Number 3    Number of Visits 17    Date for PT Re-Evaluation 05/05/21    Authorization Type Medicare/Medicaid    Progress Note Due on Visit 10    PT Start Time 1015    PT Stop Time 1100    PT Time Calculation (min) 45 min    Activity Tolerance Patient limited by fatigue    Behavior During Therapy [facility] for tasks assessed/performed             Past Medical History:  Diagnosis Date   Cardiac arrest [facility])    CHF (congestive heart failure) ([facility])    Hypertension    Nonischemic cardiomyopathy ([place])    Paroxysmal atrial fibrillation ([place])    Renal disorder     Past Surgical History:  Procedure Laterality Date   CARDIAC DEFIBRILLATOR PLACEMENT      Vitals:   03/16/21 1017  BP: (!) 138/97     Subjective Assessment - 03/16/21 1016     Subjective No abnormal feelings, no new issues. No soreness from last session.    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                [facility] PT Assessment - 03/16/21 0001       Assessment   Medical Diagnosis CVA                           [facility] Adult PT Treatment/Exercise - 03/16/21 0001       Knee/Hip Exercises: Aerobic   Recumbent Bike level 2 x 8 min with cues for 12/20 RPE      Knee/Hip Exercises: Standing   Lateral Step Up Both;2 sets;10 reps;Step Height: 6"    Forward Step Up Both;2 sets;10 reps;Hand Hold: 2;Step Height: 6"      Knee/Hip Exercises: Seated   Long Arc  Quad Strengthening;Both;3 sets;10 reps    Other Seated Knee/Hip Exercises AP 3x10    Sit to Sand 2 sets;10 reps                     PT Education - 03/16/21 1021     Education Details education on exercise sequence for efficiency, e.g. light effort warm-up, strength/power activities, lastly with steady-state cardio    Person(s) Educated Patient    Methods Explanation    Comprehension Verbalized understanding              PT Short Term Goals - 03/07/21 1352       PT SHORT TERM GOAL #1   Title Pt will be independent with HEP for improved strength, balance, transfers, and gait.  TARGET 04/07/2021    Time 5    Period Weeks    Status New      PT SHORT TERM GOAL #2   Title Pt will improve 5x sit<>stand to less than or equal to 16 sec to demonstrate  improved functional strength and transfer efficiency.    Baseline 22.22 sec    Time 5    Period Weeks    Status New      PT SHORT TERM GOAL #3   Title Pt will improve TUG score to less than or equal to 16 sec for decreased fall risk.    Baseline 21.06 sec    Time 5    Period Weeks    Status New      PT SHORT TERM GOAL #4   Title Pt will verbalize understanding of fall prevention in home environment.    Time 5    Period Weeks    Status New               PT Long Term Goals - 03/07/21 1354       PT LONG TERM GOAL #1   Title Pt will be independent with progression of HEP for improved strength, balance, transfers, and gait.  TARGET 05/05/2021    Time 9    Period Weeks    Status New      PT LONG TERM GOAL #2   Title Pt will improve 5x sit<>stand to less than or equal to 11.5 sec to demonstrate improved functional strength and transfer efficiency.    Time 9    Period Weeks    Status New      PT LONG TERM GOAL #3   Title Pt will improve TUG score to less than or equal to 13.5 sec for decreased fall risk.    Time 9    Period Weeks    Status New      PT LONG TERM GOAL #4   Title Pt will improve gait  velocity to at least 2.3 ft/sec for improved gait efficiency and safety.    Baseline 1.5 ft/sec    Time 9    Period Weeks    Status New      PT LONG TERM GOAL #5   Title Further Gait and balance testing (FGA/DGI and/or ) to be assessed/goal written as appropriate.    Time 9    Period Weeks    Status New                   Plan - 03/16/21 1055     Clinical Impression Statement Tolerating current regimen well without adverse effects and maintaining RPE of 11-12/20 throughout prescription and no adverse BP response noted.  Progressing well with generalized program development for emphasis on strength improvement and cardiovascular endurance to improve overall functional activity tolerance and participaion with activities with family.  Continued sessions to progress and refine HEP and increase intensity as able    Personal Factors and Comorbidities Comorbidity 3+    Comorbidities PMH:  v fib arrest s/p ICD placement 2019, CVA 2017, a-fib, HTN, CKD    Examination-Activity Limitations Locomotion Level;Transfers;Stairs;Stand    Examination-Participation Restrictions Occupation;Community Activity;Driving    Stability/Clinical Decision Making Evolving/Moderate complexity    Rehab Potential Good    PT Frequency 2x / week    PT Duration 8 weeks   plus eval= 9 weeks POC   PT Treatment/Interventions ADLs/Self Care Home Management;Gait training;Stair training;Functional mobility training;Therapeutic activities;Therapeutic exercise;Balance training;Neuromuscular re-education;Patient/family education    PT Next Visit Plan Review initial HEP, continue to progress HEP for functional strength, gait training, balance.  May need to further assess DGI/FGA (unable to do at eval due to time constraints) or 6 MWT and set goals as appropriate.  Monitor  vitals with activity in session.    PT Home Exercise Plan step up, lateral step ups, shadow boxing, sit to stand w/out UE    Consulted and Agree with  Plan of Care Patient             Patient will benefit from skilled therapeutic intervention in order to improve the following deficits and impairments:  Abnormal gait, Difficulty walking, Decreased balance, Decreased activity tolerance, Decreased endurance, Decreased mobility, Decreased strength  Visit Diagnosis: Unsteadiness on feet  Muscle weakness (generalized)  Other abnormalities of gait and mobility     Problem List Patient Active Problem List   Diagnosis Date Noted   TIA (transient ischemic attack) 01/25/2021   AKI (acute kidney injury) ([place]) 01/25/2021   Paroxysmal atrial fibrillation ([place]) 01/25/2021   Hypertension 01/25/2021   Hyperlipidemia LDL goal <70 01/25/2021    Toniann Fail, PT 03/16/2021, 10:59 AM 11:00 AM, 03/16/21    [facility] [address] [place], [place], [address] Phone: [phone]   Fax:  [phone]  Name: Barnell Mane MRN: [id number] Date of Birth: October 17, 1965

## 2021-03-20 ENCOUNTER — Other Ambulatory Visit: Payer: Self-pay

## 2021-03-20 ENCOUNTER — Encounter (HOSPITAL_COMMUNITY): Payer: Self-pay

## 2021-03-20 ENCOUNTER — Emergency Department (HOSPITAL_COMMUNITY): Payer: Medicare Other

## 2021-03-20 ENCOUNTER — Inpatient Hospital Stay (HOSPITAL_COMMUNITY)
Admission: EM | Admit: 2021-03-20 | Discharge: 2021-03-22 | DRG: 193 | Disposition: A | Discharge status: Home / Self Care | Payer: Medicare Other | Attending: Family Medicine | Admitting: Family Medicine

## 2021-03-20 DIAGNOSIS — N1832 Chronic kidney disease, stage 3b: Secondary | ICD-10-CM | POA: Diagnosis present

## 2021-03-20 DIAGNOSIS — E876 Hypokalemia: Secondary | ICD-10-CM | POA: Diagnosis present

## 2021-03-20 DIAGNOSIS — J180 Bronchopneumonia, unspecified organism: Secondary | ICD-10-CM | POA: Diagnosis not present

## 2021-03-20 DIAGNOSIS — J9601 Acute respiratory failure with hypoxia: Secondary | ICD-10-CM

## 2021-03-20 DIAGNOSIS — E785 Hyperlipidemia, unspecified: Secondary | ICD-10-CM | POA: Diagnosis not present

## 2021-03-20 DIAGNOSIS — J189 Pneumonia, unspecified organism: Secondary | ICD-10-CM | POA: Diagnosis not present

## 2021-03-20 DIAGNOSIS — I130 Hypertensive heart and chronic kidney disease with heart failure and stage 1 through stage 4 chronic kidney disease, or unspecified chronic kidney disease: Secondary | ICD-10-CM | POA: Diagnosis present

## 2021-03-20 DIAGNOSIS — Z8674 Personal history of sudden cardiac arrest: Secondary | ICD-10-CM

## 2021-03-20 DIAGNOSIS — I5042 Chronic combined systolic (congestive) and diastolic (congestive) heart failure: Secondary | ICD-10-CM | POA: Diagnosis present

## 2021-03-20 DIAGNOSIS — Z8673 Personal history of transient ischemic attack (TIA), and cerebral infarction without residual deficits: Secondary | ICD-10-CM

## 2021-03-20 DIAGNOSIS — Z7901 Long term (current) use of anticoagulants: Secondary | ICD-10-CM

## 2021-03-20 DIAGNOSIS — N179 Acute kidney failure, unspecified: Secondary | ICD-10-CM | POA: Diagnosis not present

## 2021-03-20 DIAGNOSIS — G459 Transient cerebral ischemic attack, unspecified: Secondary | ICD-10-CM | POA: Diagnosis present

## 2021-03-20 DIAGNOSIS — Z7984 Long term (current) use of oral hypoglycemic drugs: Secondary | ICD-10-CM

## 2021-03-20 DIAGNOSIS — I480 Paroxysmal atrial fibrillation: Secondary | ICD-10-CM | POA: Diagnosis present

## 2021-03-20 DIAGNOSIS — I428 Other cardiomyopathies: Secondary | ICD-10-CM | POA: Diagnosis present

## 2021-03-20 DIAGNOSIS — Z95810 Presence of automatic (implantable) cardiac defibrillator: Secondary | ICD-10-CM

## 2021-03-20 DIAGNOSIS — Z20822 Contact with and (suspected) exposure to covid-19: Secondary | ICD-10-CM | POA: Diagnosis present

## 2021-03-20 DIAGNOSIS — Z881 Allergy status to other antibiotic agents status: Secondary | ICD-10-CM

## 2021-03-20 DIAGNOSIS — Z79899 Other long term (current) drug therapy: Secondary | ICD-10-CM

## 2021-03-20 LAB — CBC WITH DIFFERENTIAL/PLATELET
Abs Immature Granulocytes: 0.02 10*3/uL (ref 0.00–0.07)
Basophils Absolute: 0 10*3/uL (ref 0.0–0.1)
Basophils Relative: 0 %
Eosinophils Absolute: 0 10*3/uL (ref 0.0–0.5)
Eosinophils Relative: 0 %
HCT: 44.7 % (ref 39.0–52.0)
Hemoglobin: 14.8 g/dL (ref 13.0–17.0)
Immature Granulocytes: 0 %
Lymphocytes Relative: 21 %
Lymphs Abs: 1.8 10*3/uL (ref 0.7–4.0)
MCH: 30.2 pg (ref 26.0–34.0)
MCHC: 33.1 g/dL (ref 30.0–36.0)
MCV: 91.2 fL (ref 80.0–100.0)
Monocytes Absolute: 0.8 10*3/uL (ref 0.1–1.0)
Monocytes Relative: 9 %
Neutro Abs: 6.1 10*3/uL (ref 1.7–7.7)
Neutrophils Relative %: 70 %
Platelets: 179 10*3/uL (ref 150–400)
RBC: 4.9 MIL/uL (ref 4.22–5.81)
RDW: 17.9 % — ABNORMAL HIGH (ref 11.5–15.5)
WBC: 8.7 10*3/uL (ref 4.0–10.5)
nRBC: 0 % (ref 0.0–0.2)

## 2021-03-20 LAB — BASIC METABOLIC PANEL
Anion gap: 9 (ref 5–15)
BUN: 17 mg/dL (ref 6–20)
CO2: 27 mmol/L (ref 22–32)
Calcium: 8.6 mg/dL — ABNORMAL LOW (ref 8.9–10.3)
Chloride: 101 mmol/L (ref 98–111)
Creatinine, Ser: 2.29 mg/dL — ABNORMAL HIGH (ref 0.61–1.24)
GFR, Estimated: 33 mL/min — ABNORMAL LOW (ref >60)
Glucose, Bld: 104 mg/dL — ABNORMAL HIGH (ref 70–99)
Potassium: 2.7 mmol/L — CL (ref 3.5–5.1)
Sodium: 137 mmol/L (ref 135–145)

## 2021-03-20 LAB — BRAIN NATRIURETIC PEPTIDE: B Natriuretic Peptide: 877.8 pg/mL — ABNORMAL HIGH (ref 0.0–100.0)

## 2021-03-20 LAB — LACTIC ACID, PLASMA: Lactic Acid, Venous: 1.3 mmol/L (ref 0.5–1.9)

## 2021-03-20 LAB — RESP PANEL BY RT-PCR (FLU A&B, COVID) ARPGX2
Influenza A by PCR: NEGATIVE
Influenza B by PCR: NEGATIVE
SARS Coronavirus 2 by RT PCR: NEGATIVE

## 2021-03-20 MED ORDER — LEVOFLOXACIN IN D5W 750 MG/150ML IV SOLN
750.0000 mg | Freq: Once | INTRAVENOUS | Status: AC
  Administered 2021-03-20: 750 mg via INTRAVENOUS
  Filled 2021-03-20: qty 150

## 2021-03-20 MED ORDER — POTASSIUM CHLORIDE CRYS ER 20 MEQ PO TBCR
60.0000 meq | EXTENDED_RELEASE_TABLET | Freq: Once | ORAL | Status: AC
  Administered 2021-03-21: 60 meq via ORAL
  Filled 2021-03-20: qty 3

## 2021-03-20 MED ORDER — POTASSIUM CHLORIDE 10 MEQ/100ML IV SOLN
10.0000 meq | Freq: Once | INTRAVENOUS | Status: AC
  Administered 2021-03-21: 10 meq via INTRAVENOUS
  Filled 2021-03-20: qty 100

## 2021-03-20 NOTE — ED Triage Notes (Signed)
Pt complains of cough x 2 weeks. Pt placed on 3 L nasal cannula due to low O2 levels. Pt reports having a stroke in December 2022.

## 2021-03-20 NOTE — ED Notes (Signed)
Lab called critical potassium of 2.4.

## 2021-03-20 NOTE — ED Provider Notes (Addendum)
[facility] DEPT Provider Note   CSN: [id number] Arrival date & time: 03/20/21  2009     History  Chief Complaint  Patient presents with   Cough    Lindle Marmolejos is a 56 y.o. male.  56 year old male presents with several days of cough, congestion, shortness of breath.  No fever but did have emesis yesterday.  Notes increased dyspnea on exertion.  No headache, neck pain, photophobia.  Patient does have a history of irregular heartbeat and is on Eliquis.  He also has a history of CHF as well 2 and cardiac arrest.      Home Medications Prior to Admission medications   Medication Sig Start Date End Date Taking? Authorizing Provider  amLODipine (NORVASC) 10 MG tablet Take 10 mg by mouth at bedtime. 01/22/21   [provider]  apixaban (ELIQUIS) 5 MG TABS tablet Take 1 tablet (5 mg total) by mouth 2 (two) times daily. 01/26/21   Gifford Shave, MD  atorvastatin (LIPITOR) 80 MG tablet Take 1 tablet (80 mg total) by mouth daily. 01/27/21 02/26/21  Gifford Shave, MD  carvedilol (COREG) 25 MG tablet Take 1 tablet (25 mg total) by mouth 2 (two) times daily with a meal. 01/26/21 02/25/21  Gifford Shave, MD  empagliflozin (JARDIANCE) 10 MG TABS tablet Take by mouth daily.    [provider]  furosemide (LASIX) 40 MG tablet Take 40 mg by mouth at bedtime. 12/16/20   [provider]  PACERONE 200 MG tablet Take 200 mg by mouth at bedtime. 01/17/21   [provider]      Allergies    Cephalexin    Review of Systems   Review of Systems  All other systems reviewed and are negative.  Physical Exam Updated Vital Signs BP 113/88 (BP Location: Left Arm)    Pulse 72    Temp 99.6 F (37.6 C) (Oral)    Resp (!) 26    Ht 1.778 m (5\' 10" )    Wt 117 kg    SpO2 90%    BMI 37.02 kg/m  Physical Exam Vitals and nursing note reviewed.  Constitutional:      General: He is not in acute distress.    Appearance: Normal appearance. He  is well-developed. He is not toxic-appearing.  HENT:     Head: Normocephalic and atraumatic.  Eyes:     General: Lids are normal.     Conjunctiva/sclera: Conjunctivae normal.     Pupils: Pupils are equal, round, and reactive to light.  Neck:     Thyroid: No thyroid mass.     Trachea: No tracheal deviation.  Cardiovascular:     Rate and Rhythm: Normal rate and regular rhythm.     Heart sounds: Normal heart sounds. No murmur heard.   No gallop.  Pulmonary:     Effort: Pulmonary effort is normal. No respiratory distress.     Breath sounds: No stridor. Decreased breath sounds and wheezing present. No rhonchi or rales.  Abdominal:     General: There is no distension.     Palpations: Abdomen is soft.     Tenderness: There is no abdominal tenderness. There is no rebound.  Musculoskeletal:        General: No tenderness. Normal range of motion.     Cervical back: Normal range of motion and neck supple.  Skin:    General: Skin is warm and dry.     Findings: No abrasion or rash.  Neurological:  Mental Status: He is alert and oriented to person, place, and time. Mental status is at baseline.     GCS: GCS eye subscore is 4. GCS verbal subscore is 5. GCS motor subscore is 6.     Cranial Nerves: No cranial nerve deficit.     Sensory: No sensory deficit.     Motor: Motor function is intact.  Psychiatric:        Attention and Perception: Attention normal.        Speech: Speech normal.        Behavior: Behavior normal.    ED Results / Procedures / Treatments   Labs (all labs ordered are listed, but only abnormal results are displayed) Labs Reviewed  RESP PANEL BY RT-PCR (FLU A&B, COVID) ARPGX2  CULTURE, BLOOD (ROUTINE X 2)  CULTURE, BLOOD (ROUTINE X 2)  CBC WITH DIFFERENTIAL/PLATELET  BASIC METABOLIC PANEL  LACTIC ACID, PLASMA    EKG EKG Interpretation  Date/Time:  Monday March 20 2021 21:27:19 EST Ventricular Rate:  78 PR Interval:    QRS Duration: 111 QT  Interval:  410 QTC Calculation: 446 R Axis:   265 Text Interpretation: Atrial fibrillation Left anterior fascicular block Probable right ventricular hypertrophy Abnormal lateral Q waves No significant change since last tracing Confirmed by Lacretia Leigh ([id number]) on 03/20/2021 10:05:37 PM  Radiology DG Chest 2 View  Result Date: 03/20/2021 CLINICAL DATA:  Cough, short of breath for 2 weeks EXAM: CHEST - 2 VIEW COMPARISON:  01/24/2021 FINDINGS: Frontal and lateral views of the chest demonstrate a single lead pacer/AICD. Stable enlargement of the cardiac silhouette. There is patchy right upper lobe airspace disease abutting the minor fissure. No effusion or pneumothorax. No acute bony abnormalities. IMPRESSION: 1. Patchy right upper lobe airspace disease abutting the minor fissure, consistent with bronchopneumonia. 2. Stable enlarged cardiac silhouette. Electronically Signed   By: Randa Ngo M.D.   On: 03/20/2021 21:24    Procedures Procedures    Medications Ordered in ED Medications - No data to display  ED Course/ Medical Decision Making/ A&P                           Medical Decision Making Amount and/or Complexity of Data Reviewed Labs: ordered.  Risk Prescription drug management.   Patient's EKG per my interpretation shows atrial fibrillation which is unchanged from prior.  Patient is on Eliquis for this.  Chest x-ray shows pneumonia.  Patient started on IV antibiotics.  Patient has new oxygen requirement due to his pneumonia and was placed on 4 L of oxygen.  COVID and flu test were negative.  Patient with moderate hypokalemia with potassium of 2.7.  He was given IV as well as oral potassium.  We will add magnesium.  Plan will be for admission to the hospital service  CRITICAL CARE Performed by: Leota Jacobsen Total critical care time: 45 minutes Critical care time was exclusive of separately billable procedures and treating other patients. Critical care was necessary to treat  or prevent imminent or life-threatening deterioration. Critical care was time spent personally by me on the following activities: development of treatment plan with patient and/or surrogate as well as nursing, discussions with consultants, evaluation of patient's response to treatment, examination of patient, obtaining history from patient or surrogate, ordering and performing treatments and interventions, ordering and review of laboratory studies, ordering and review of radiographic studies, pulse oximetry and re-evaluation of patient's condition.  Final Clinical Impression(s) / ED Diagnoses Final diagnoses:  None    Rx / DC Orders ED Discharge Orders     None         Lacretia Leigh, MD 03/20/21 2206    Lacretia Leigh, MD 03/20/21 2339

## 2021-03-20 NOTE — ED Provider Triage Note (Signed)
Emergency Medicine Provider Triage Evaluation Note  William Henderson , a 56 y.o. male  was evaluated in triage.  Pt complains of cough, headache, dizziness and mild shortness of breath.  Review of Systems  Positive:  Negative: See above   Physical Exam  BP 113/88 (BP Location: Left Arm)    Pulse 72    Temp 99.6 F (37.6 C) (Oral)    Resp 16    Ht 5\' 10"  (1.778 m)    Wt 117 kg    SpO2 90%    BMI 37.02 kg/m  Gen:   Awake, no distress   Resp:  Tachypneic.  Diffuse expiratory wheeze and rhonchi. MSK:   Moves extremities without difficulty  Other:    Medical Decision Making  Medically screening exam initiated at 9:04 PM.  Appropriate orders placed.  William Henderson was informed that the remainder of the evaluation will be completed by another provider, this initial triage assessment does not replace that evaluation, and the importance of remaining in the ED until their evaluation is complete.  Patient was 87% on room air.  He was promptly put on 3 L nasal cannula and went up to 92 to 93%.  Given his lung physical exam findings I do believe he is in need of emergent care.   [place], [place] 03/20/21 2107

## 2021-03-21 ENCOUNTER — Encounter (HOSPITAL_COMMUNITY): Payer: Self-pay | Admitting: Internal Medicine

## 2021-03-21 DIAGNOSIS — J189 Pneumonia, unspecified organism: Secondary | ICD-10-CM | POA: Diagnosis present

## 2021-03-21 DIAGNOSIS — I428 Other cardiomyopathies: Secondary | ICD-10-CM | POA: Diagnosis present

## 2021-03-21 DIAGNOSIS — E876 Hypokalemia: Secondary | ICD-10-CM | POA: Diagnosis present

## 2021-03-21 DIAGNOSIS — N179 Acute kidney failure, unspecified: Secondary | ICD-10-CM | POA: Diagnosis present

## 2021-03-21 DIAGNOSIS — Z8673 Personal history of transient ischemic attack (TIA), and cerebral infarction without residual deficits: Secondary | ICD-10-CM | POA: Diagnosis not present

## 2021-03-21 DIAGNOSIS — Z95810 Presence of automatic (implantable) cardiac defibrillator: Secondary | ICD-10-CM | POA: Diagnosis not present

## 2021-03-21 DIAGNOSIS — Z79899 Other long term (current) drug therapy: Secondary | ICD-10-CM | POA: Diagnosis not present

## 2021-03-21 DIAGNOSIS — J9601 Acute respiratory failure with hypoxia: Secondary | ICD-10-CM | POA: Diagnosis present

## 2021-03-21 DIAGNOSIS — I5042 Chronic combined systolic (congestive) and diastolic (congestive) heart failure: Secondary | ICD-10-CM | POA: Diagnosis present

## 2021-03-21 DIAGNOSIS — Z8674 Personal history of sudden cardiac arrest: Secondary | ICD-10-CM

## 2021-03-21 DIAGNOSIS — E785 Hyperlipidemia, unspecified: Secondary | ICD-10-CM | POA: Diagnosis present

## 2021-03-21 DIAGNOSIS — Z7984 Long term (current) use of oral hypoglycemic drugs: Secondary | ICD-10-CM | POA: Diagnosis not present

## 2021-03-21 DIAGNOSIS — I480 Paroxysmal atrial fibrillation: Secondary | ICD-10-CM | POA: Diagnosis present

## 2021-03-21 DIAGNOSIS — Z7901 Long term (current) use of anticoagulants: Secondary | ICD-10-CM | POA: Diagnosis not present

## 2021-03-21 DIAGNOSIS — N1832 Chronic kidney disease, stage 3b: Secondary | ICD-10-CM | POA: Diagnosis present

## 2021-03-21 DIAGNOSIS — J180 Bronchopneumonia, unspecified organism: Secondary | ICD-10-CM | POA: Diagnosis present

## 2021-03-21 DIAGNOSIS — Z881 Allergy status to other antibiotic agents status: Secondary | ICD-10-CM | POA: Diagnosis not present

## 2021-03-21 DIAGNOSIS — I130 Hypertensive heart and chronic kidney disease with heart failure and stage 1 through stage 4 chronic kidney disease, or unspecified chronic kidney disease: Secondary | ICD-10-CM | POA: Diagnosis present

## 2021-03-21 DIAGNOSIS — Z20822 Contact with and (suspected) exposure to covid-19: Secondary | ICD-10-CM | POA: Diagnosis present

## 2021-03-21 LAB — CBC WITH DIFFERENTIAL/PLATELET
Abs Immature Granulocytes: 0.02 10*3/uL (ref 0.00–0.07)
Basophils Absolute: 0 10*3/uL (ref 0.0–0.1)
Basophils Relative: 0 %
Eosinophils Absolute: 0 10*3/uL (ref 0.0–0.5)
Eosinophils Relative: 0 %
HCT: 45.9 % (ref 39.0–52.0)
Hemoglobin: 15.1 g/dL (ref 13.0–17.0)
Immature Granulocytes: 0 %
Lymphocytes Relative: 18 %
Lymphs Abs: 1.5 10*3/uL (ref 0.7–4.0)
MCH: 30.3 pg (ref 26.0–34.0)
MCHC: 32.9 g/dL (ref 30.0–36.0)
MCV: 92 fL (ref 80.0–100.0)
Monocytes Absolute: 0.8 10*3/uL (ref 0.1–1.0)
Monocytes Relative: 9 %
Neutro Abs: 6.2 10*3/uL (ref 1.7–7.7)
Neutrophils Relative %: 73 %
Platelets: 167 10*3/uL (ref 150–400)
RBC: 4.99 MIL/uL (ref 4.22–5.81)
RDW: 18 % — ABNORMAL HIGH (ref 11.5–15.5)
WBC: 8.5 10*3/uL (ref 4.0–10.5)
nRBC: 0 % (ref 0.0–0.2)

## 2021-03-21 LAB — COMPREHENSIVE METABOLIC PANEL
ALT: 21 U/L (ref 0–44)
AST: 32 U/L (ref 15–41)
Albumin: 3.7 g/dL (ref 3.5–5.0)
Alkaline Phosphatase: 47 U/L (ref 38–126)
Anion gap: 8 (ref 5–15)
BUN: 18 mg/dL (ref 6–20)
CO2: 27 mmol/L (ref 22–32)
Calcium: 8.3 mg/dL — ABNORMAL LOW (ref 8.9–10.3)
Chloride: 101 mmol/L (ref 98–111)
Creatinine, Ser: 2.01 mg/dL — ABNORMAL HIGH (ref 0.61–1.24)
GFR, Estimated: 38 mL/min — ABNORMAL LOW (ref >60)
Glucose, Bld: 110 mg/dL — ABNORMAL HIGH (ref 70–99)
Potassium: 3.1 mmol/L — ABNORMAL LOW (ref 3.5–5.1)
Sodium: 136 mmol/L (ref 135–145)
Total Bilirubin: 0.8 mg/dL (ref 0.3–1.2)
Total Protein: 7.9 g/dL (ref 6.5–8.1)

## 2021-03-21 LAB — URINALYSIS, COMPLETE (UACMP) WITH MICROSCOPIC
Bacteria, UA: NONE SEEN
Bilirubin Urine: NEGATIVE
Glucose, UA: >=500 mg/dL — AB
Ketones, ur: NEGATIVE mg/dL
Leukocytes,Ua: NEGATIVE
Nitrite: NEGATIVE
Protein, ur: 100 mg/dL — AB
Specific Gravity, Urine: 1.015 (ref 1.005–1.030)
pH: 5 (ref 5.0–8.0)

## 2021-03-21 LAB — STREP PNEUMONIAE URINARY ANTIGEN: Strep Pneumo Urinary Antigen: NEGATIVE

## 2021-03-21 LAB — PHOSPHORUS: Phosphorus: 5 mg/dL — ABNORMAL HIGH (ref 2.5–4.6)

## 2021-03-21 LAB — MAGNESIUM: Magnesium: 1.8 mg/dL (ref 1.7–2.4)

## 2021-03-21 LAB — PROCALCITONIN: Procalcitonin: <0.1 ng/mL

## 2021-03-21 LAB — CREATININE, URINE, RANDOM: Creatinine, Urine: 215.91 mg/dL

## 2021-03-21 LAB — SODIUM, URINE, RANDOM: Sodium, Ur: <10 mmol/L

## 2021-03-21 MED ORDER — AMLODIPINE BESYLATE 10 MG PO TABS
10.0000 mg | ORAL_TABLET | Freq: Every day | ORAL | Status: DC
  Administered 2021-03-21: 10 mg via ORAL
  Filled 2021-03-21: qty 1

## 2021-03-21 MED ORDER — ATORVASTATIN CALCIUM 40 MG PO TABS
80.0000 mg | ORAL_TABLET | Freq: Every day | ORAL | Status: DC
  Administered 2021-03-21 – 2021-03-22 (×2): 80 mg via ORAL
  Filled 2021-03-21 (×2): qty 2

## 2021-03-21 MED ORDER — ACETAMINOPHEN 325 MG PO TABS: 650.0000 mg | ORAL_TABLET | Freq: Four times a day (QID) | ORAL | Status: DC | PRN

## 2021-03-21 MED ORDER — AMIODARONE HCL 200 MG PO TABS
200.0000 mg | ORAL_TABLET | Freq: Every day | ORAL | Status: DC
  Administered 2021-03-21: 200 mg via ORAL
  Filled 2021-03-21: qty 1

## 2021-03-21 MED ORDER — SODIUM CHLORIDE 0.9 % IV SOLN
2.0000 g | INTRAVENOUS | Status: DC
  Administered 2021-03-21: 2 g via INTRAVENOUS
  Filled 2021-03-21 (×2): qty 20

## 2021-03-21 MED ORDER — CARVEDILOL 25 MG PO TABS
25.0000 mg | ORAL_TABLET | Freq: Two times a day (BID) | ORAL | Status: DC
  Administered 2021-03-21 – 2021-03-22 (×3): 25 mg via ORAL
  Filled 2021-03-21 (×2): qty 2
  Filled 2021-03-21: qty 1

## 2021-03-21 MED ORDER — ACETAMINOPHEN 650 MG RE SUPP
650.0000 mg | Freq: Four times a day (QID) | RECTAL | Status: DC | PRN
  Filled 2021-03-21: qty 1

## 2021-03-21 MED ORDER — LEVOFLOXACIN IN D5W 750 MG/150ML IV SOLN
750.0000 mg | INTRAVENOUS | Status: DC
  Filled 2021-03-21: qty 150

## 2021-03-21 MED ORDER — LEVOFLOXACIN IN D5W 750 MG/150ML IV SOLN: 750.0000 mg | INTRAVENOUS | Status: DC

## 2021-03-21 MED ORDER — SODIUM CHLORIDE 0.9 % IV SOLN
100.0000 mg | Freq: Two times a day (BID) | INTRAVENOUS | Status: DC
  Administered 2021-03-21 – 2021-03-22 (×2): 100 mg via INTRAVENOUS
  Filled 2021-03-21 (×5): qty 100

## 2021-03-21 MED ORDER — POTASSIUM CHLORIDE CRYS ER 20 MEQ PO TBCR
40.0000 meq | EXTENDED_RELEASE_TABLET | Freq: Once | ORAL | Status: AC
Start: 2021-03-21 — End: 2021-03-21
  Administered 2021-03-21: 40 meq via ORAL
  Filled 2021-03-21: qty 2

## 2021-03-21 MED ORDER — FUROSEMIDE 40 MG PO TABS
40.0000 mg | ORAL_TABLET | Freq: Every day | ORAL | Status: DC
  Administered 2021-03-21 – 2021-03-22 (×2): 40 mg via ORAL
  Filled 2021-03-21 (×2): qty 1

## 2021-03-21 MED ORDER — APIXABAN 5 MG PO TABS
5.0000 mg | ORAL_TABLET | Freq: Two times a day (BID) | ORAL | Status: DC
  Administered 2021-03-21 – 2021-03-22 (×3): 5 mg via ORAL
  Filled 2021-03-21 (×3): qty 1

## 2021-03-21 NOTE — Plan of Care (Signed)
  Problem: Clinical Measurements: Goal: Respiratory complications will improve Outcome: Progressing   Problem: Activity: Goal: Risk for activity intolerance will decrease Outcome: Progressing   

## 2021-03-21 NOTE — Assessment & Plan Note (Addendum)
History of. Continue eliquis, lipitor.

## 2021-03-21 NOTE — Progress Notes (Signed)
Pharmacy Antibiotic Note  William Henderson is a 56 y.o. male admitted on 03/20/2021 with pneumonia.  Pharmacy has been consulted for Levaquin dosing.  Cephalexin allergy= hives CKD- Scr 1.9-2.0 in recent past   Plan: Levaquin 750mg  IV q48h for CrCl 30-75ml/min Change to PO once appropriate Monitor renal function and cx data    Height: 5\' 10"  (177.8 cm) Weight: 117 kg (258 lb) IBW/kg (Calculated) : 73  Temp (24hrs), Avg:99.6 F (37.6 C), Min:99.6 F (37.6 C), Max:99.6 F (37.6 C)  Recent Labs  Lab 03/20/21 2248  WBC 8.7  CREATININE 2.29*  LATICACIDVEN 1.3    Estimated Creatinine Clearance: 46.2 mL/min (A) (by C-G formula based on SCr of 2.29 mg/dL (H)).    Allergies  Allergen Reactions   Cephalexin Hives    Antimicrobials this admission: 2/13 Levaquin>>  Dose adjustments this admission:  Microbiology results: 2/13 BCx:   Thank you for allowing pharmacy to be a part of this patients care.  3/13 PharmD 03/21/2021 12:22 AM

## 2021-03-21 NOTE — ED Notes (Signed)
Patient is aware we need urine sample, still has not been able to provide one. Has a urinal at bedside so he can give on when he is able.

## 2021-03-21 NOTE — Assessment & Plan Note (Addendum)
Continue Eliquis, Coreg and Amiodarone.

## 2021-03-21 NOTE — ED Notes (Signed)
Patient ambulated to the BR without oxygen, saturations dropped to 82% on room air.  Placed patient back on 3L oxygen with improvement in oxygen saturations to 93-96%.

## 2021-03-21 NOTE — ED Notes (Signed)
Pt care taken, no complaints at this time. 

## 2021-03-21 NOTE — Assessment & Plan Note (Addendum)
Stable. Continue Coreg, Lasix, Jardiance.

## 2021-03-21 NOTE — Assessment & Plan Note (Addendum)
Secondary to pneumonia. Oxygen use weaned to room air. Patient ambulated without oxygen and did not qualify for home oxygen use.

## 2021-03-21 NOTE — Hospital Course (Addendum)
William Henderson is a 56 y.o. male with medical history significant for chronic combined systolic/diastolic heart failure, hypertension, hyperlipidemia, paroxysmal atrial fibrillation chronically anticoagulated on Eliquis, stage IIIb chronic kidney disease with baseline creatinine 1.9-2.0, who was admitted to [facility] on 03/20/2021 with suspected community-acquired pneumonia after presenting from home to [facility] ED complaining of shortness of breath. He was started on empiric antibiotic treatment with improvement of symptoms. Patient discharged to continue antibiotic treatment.

## 2021-03-21 NOTE — ED Notes (Signed)
Attempted to call report x 1  

## 2021-03-21 NOTE — Assessment & Plan Note (Addendum)
S/p ICD 

## 2021-03-21 NOTE — Assessment & Plan Note (Addendum)
Right upper lobe airspace disease consistent with bronchopneumonia. Patient initially started on treatment with Levaquin which was transitioned to Ceftriaxone and Doxycycline. Patient tolerated this modified dose with no allergic reaction. Patient transitioned to [facility] and doxycycline on discharge.

## 2021-03-21 NOTE — ED Notes (Signed)
Patient is aware we still need urine sample, still has not been able to provide one. Has a urinal at bedside so he can give on when he is able.

## 2021-03-21 NOTE — Progress Notes (Signed)
PHARMACY NOTE:  ANTIMICROBIAL RENAL DOSAGE ADJUSTMENT  Current antimicrobial regimen includes a mismatch between antimicrobial dosage and estimated renal function.  As per policy approved by the Pharmacy & Therapeutics and Medical Executive Committees, the antimicrobial dosage will be adjusted accordingly.  Current antimicrobial dosage:  levofloxacin 750mg  IV q48 hours  Indication: pneumonia  Renal Function:  Estimated Creatinine Clearance: 52.6 mL/min (A) (by C-G formula based on SCr of 2.01 mg/dL (H)). []      On intermittent HD, scheduled: []      On CRRT    Antimicrobial dosage has been changed to:  levofloxacin 750mg  IV q24 hours  Additional comments: Scr appears to have normalized to patient's baseline of Scr 1.9-2   Thank you for allowing pharmacy to be a part of this patient's care.  , PharmD 03/21/2021 12:59 PM

## 2021-03-21 NOTE — H&P (Signed)
History and Physical    PLEASE NOTE THAT DRAGON DICTATION SOFTWARE WAS USED IN THE CONSTRUCTION OF THIS NOTE.   Lior Franchi WPV:[id number] DOB: Sep 29, 1965 DOA: 03/20/2021  PCP: Candi Leash, PA-C  Patient coming from: home   I have personally briefly reviewed patient's old medical records in [place]  Chief Complaint: Shortness of breath  HPI: William Henderson is a 56 y.o. male with medical history significant for chronic combined systolic/diastolic heart failure, hypertension, hyperlipidemia, paroxysmal atrial fibrillation chronically anticoagulated on Eliquis, stage IIIb chronic kidney disease with baseline creatinine 1.9-2.0, who is admitted to [facility] on 03/20/2021 with suspected community-acquired pneumonia after presenting from home to [facility] ED complaining of shortness of breath.   The patient reports 3 to 4 days of progressive shortness of breath associated with new onset productive cough and subjective fever.  Denies any associated orthopnea, PND, or worsening peripheral edema.  Not associate with any recent chest pain, diaphoresis, palpitations, nausea, vomiting, presyncope, or syncope.  No wheezing, hemoptysis, new lower extremity erythema, or calf tenderness.  Denies any recent dysuria, gross hematuria, or change in urinary urgency/frequency.  Medical history notable for chronic combined systolic/diastolic heart failure, with most recent echocardiogram December 2022 notable for LVEF 20 to 25% and indeterminate diastolic parameters.  He is status post ICD placement.  Denies any known baseline supplemental oxygen requirements.   ED Course:  Vital signs in the ED were notable for the following: Temperature max 99.6; heart rate 73-90; blood pressure 104/84-125/78; respiratory rate 19-27; initial oxygen saturation 85% on room air, with ensuing increase into the range of 94-96 on 3 L nasal cannula.'s  Labs were notable for the following: BMP notable for the following:  Sodium 137, potassium 2.7, creatinine 2.29 relative to most recent prior serum creatinine data point of 1.90 on 01/26/2021.  BNP 880 compared to most recent prior value of 71 in October 2022.  CBC notable for white blood cell count 8700.  Lactic acid 1.3.  COVID-19/influenza PCR negative.  Blood cultures x2 were collected prior to initiation of antibiotics in the emergency department.  Imaging and additional notable ED work-up: EKG showed atrial fibrillation with heart rate 78, no evidence of T wave or ST changes, including no evidence of ST elevation.  Chest x-ray showed patchy right upper lobe airspace disease consistent with bronchopneumonia in the absence of evidence of interstitial or pulmonary edema and no evidence of pleural effusion or pneumothorax.  While in the ED, the following were administered: Potassium chloride 60 mEq p.o. x1, potassium chloride 10 mEq IV over 1 hour x 1 dose, and Levaquin.  Subsequently, the patient was admitted for further evaluation and management of community-acquired pneumonia complicated by acute hypoxic respiratory distress hypokalemia, and acute kidney injury superimposed on stage IIIb chronic kidney disease.    Review of Systems: As per HPI otherwise 10 point review of systems negative.   Past Medical History:  Diagnosis Date   Cardiac arrest [facility])    CHF (congestive heart failure) (HCC)    Hypertension    Nonischemic cardiomyopathy (HCC)    Paroxysmal atrial fibrillation (HCC)    Renal disorder     Past Surgical History:  Procedure Laterality Date   CARDIAC DEFIBRILLATOR PLACEMENT      Social History:  reports that he has never smoked. He has never used smokeless tobacco. He reports that he does not drink alcohol and does not use drugs.   Allergies  Allergen Reactions   Cephalexin Hives  History reviewed. No pertinent family history.   Prior to Admission medications   Medication Sig Start Date End Date Taking? Authorizing Provider   amLODipine (NORVASC) 10 MG tablet Take 10 mg by mouth at bedtime. 01/22/21   [provider]  apixaban (ELIQUIS) 5 MG TABS tablet Take 1 tablet (5 mg total) by mouth 2 (two) times daily. 01/26/21   Gifford Shave, MD  atorvastatin (LIPITOR) 80 MG tablet Take 1 tablet (80 mg total) by mouth daily. 01/27/21 02/26/21  Gifford Shave, MD  carvedilol (COREG) 25 MG tablet Take 1 tablet (25 mg total) by mouth 2 (two) times daily with a meal. 01/26/21 02/25/21  Gifford Shave, MD  empagliflozin (JARDIANCE) 10 MG TABS tablet Take by mouth daily.    [provider]  furosemide (LASIX) 40 MG tablet Take 40 mg by mouth at bedtime. 12/16/20   [provider]  PACERONE 200 MG tablet Take 200 mg by mouth at bedtime. 01/17/21   [provider]     Objective    Physical Exam: Vitals:   03/21/21 0345 03/21/21 0445 03/21/21 0530 03/21/21 0600  BP: 125/78 110/71 129/84 109/86  Pulse: 72 80 (!) 46 65  Resp: (!) 22 14 14  (!) 22  Temp:      TempSrc:      SpO2: 96% 94% 95% 94%  Weight:      Height:        General: appears to be stated age; alert, oriented; mildly increased work of breathing noted Skin: warm, dry, no rash Head:  AT/[place] Mouth:  Oral mucosa membranes appear moist, normal dentition Neck: supple; trachea midline Heart:  RRR; did not appreciate any M/R/G Lungs: CTAB, did not appreciate any wheezes, rales, or rhonchi Abdomen: + BS; soft, ND, NT Vascular: 2+ pedal pulses b/l; 2+ radial pulses b/l Extremities: no peripheral edema, no muscle wasting Neuro: strength and sensation intact in upper and lower extremities b/l   Labs on Admission: I have personally reviewed following labs and imaging studies  CBC: Recent Labs  Lab 03/20/21 2248 03/21/21 0445  WBC 8.7 8.5  NEUTROABS 6.1 6.2  HGB 14.8 15.1  HCT 44.7 45.9  MCV 91.2 92.0  PLT 179 115   Basic Metabolic Panel: Recent Labs  Lab 03/20/21 2248 03/21/21 0445  NA 137 136  K 2.7* 3.1*   CL 101 101  CO2 27 27  GLUCOSE 104* 110*  BUN 17 18  CREATININE 2.29* 2.01*  CALCIUM 8.6* 8.3*  MG  --  1.8  PHOS  --  5.0*   GFR: Estimated Creatinine Clearance: 52.6 mL/min (A) (by C-G formula based on SCr of 2.01 mg/dL (H)). Liver Function Tests: Recent Labs  Lab 03/21/21 0445  AST 32  ALT 21  ALKPHOS 47  BILITOT 0.8  PROT 7.9  ALBUMIN 3.7   No results for input(s): LIPASE, AMYLASE in the last 168 hours. No results for input(s): AMMONIA in the last 168 hours. Coagulation Profile: No results for input(s): INR, PROTIME in the last 168 hours. Cardiac Enzymes: No results for input(s): CKTOTAL, CKMB, CKMBINDEX, TROPONINI in the last 168 hours. BNP (last 3 results) No results for input(s): PROBNP in the last 8760 hours. HbA1C: No results for input(s): HGBA1C in the last 72 hours. CBG: No results for input(s): GLUCAP in the last 168 hours. Lipid Profile: No results for input(s): CHOL, HDL, LDLCALC, TRIG, CHOLHDL, LDLDIRECT in the last 72 hours. Thyroid Function Tests: No results for input(s): TSH, T4TOTAL, FREET4, T3FREE, THYROIDAB in  the last 72 hours. Anemia Panel: No results for input(s): VITAMINB12, FOLATE, FERRITIN, TIBC, IRON, RETICCTPCT in the last 72 hours. Urine analysis: No results found for: COLORURINE, APPEARANCEUR, [place], [place], [place], [place], BILIRUBINUR, [place], PROTEINUR, [place], NITRITE, LEUKOCYTESUR  Radiological Exams on Admission: DG Chest 2 View  Result Date: 03/20/2021 CLINICAL DATA:  Cough, short of breath for 2 weeks EXAM: CHEST - 2 VIEW COMPARISON:  01/24/2021 FINDINGS: Frontal and lateral views of the chest demonstrate a single lead pacer/AICD. Stable enlargement of the cardiac silhouette. There is patchy right upper lobe airspace disease abutting the minor fissure. No effusion or pneumothorax. No acute bony abnormalities. IMPRESSION: 1. Patchy right upper lobe airspace disease abutting the minor fissure, consistent with  bronchopneumonia. 2. Stable enlarged cardiac silhouette. Electronically Signed   By: Randa Ngo M.D.   On: 03/20/2021 21:24     EKG: Independently reviewed, with result as described above.    Assessment/Plan    Principal Problem:   CAP (community acquired pneumonia) Active Problems:   Paroxysmal atrial fibrillation (HCC)   Hyperlipidemia LDL goal <70   Hypokalemia   Acute renal failure superimposed on stage 3b chronic kidney disease (HCC)   Chronic combined systolic and diastolic heart failure (HCC)     #) Community-acquired pneumonia: Diagnosis on the basis of 3 to 4 days of progressive shortness of breath associated with new onset productive cough, subjective fever, mildly elevated temperature in the ED this evening, with chest x-ray showing patchy right upper lobe airspace disease consistent with bronchopneumonia, in the absence of any radiographic or clinical evidence to suggest acutely decompensated heart failure at this time.  In the absence of objective fever or leukocytosis, the source criteria not currently met for sepsis.  Lactate nonelevated at 1.3.  No evidence of hypotension.  In the setting of a reported history of hives to cephalosporin class of antibiotics, he received Levaquin in the emergency department this evening.  We will continue Levaquin for his community-acquired pneumonia coverage.  Of note, blood cultures x2 were collected prior to initiation of Levaquin.  Plan: Monitor for results of blood cultures x2.  Continue Levaquin.  Repeat CBC with differential in the morning.  Check procalcitonin level.  Flutter valve, NSAIDs [place].  Check strep pneumoniae urine antigen.  Monitor continuous pulse oximetry.      #) Acute hypoxic respiratory distress relative to no known baseline supplemental oxygen requirements, initial O2 sat noted to be in the mid 80s on room air, with ensuing improvement into the mid 90s on 3 L nasal cannula.  This appears to be the basis of  presenting [facility] pneumonia, as above.  As described above, clinically and radiographically, presentation appears less suggestive of acutely decompensated heart close moderate sigmoid status for ensuing development of evidence to suggest this.  Clinically, acute pulmonary embolism appears less likely, particularly given the patient's reported good compliance with chronic anticoagulation on Eliquis.  No known history of chronic underlying pulmonary pathology and this lifelong non-smoker.  ACS appears less likely as well, in the absence of any recent chest pain, while EKG shows no evidence of acute ischemic changes.  Plan: Further evaluation management presenting community-acquired pneumonia, as above.  Monitor continuous pulse oximetry.  Add on procalcitonin level.  Flutter valve, incentive spirometry.  Repeat CBC differential in the morning.  Check serum magnesium level.  Monitor strict I's and O's Daily weights.      #) Hypokalemia: Presenting serum potassium level 2.7, admitted history of prior ventricular arrhythmia.  He has  received a total of 70 mEq of potassium supplementation in the ED this evening.  We will repeat serum potassium level via BMP in the morning.    Plan: Repeat BMP in the morning.  Add on serum magnesium level.  Monitor on telemetry.       #) Acute kidney injury superimposed on stage IIIb chronic kidney disease: In the setting of baseline creatinine 1.9-2.0, presenting creatinine 2.29.  Suspect that this is prerenal in nature as a consequence of diminished oxygen delivery to the kidneys as a consequence of presenting acute hypoxic respiratory stress stemming from community-acquired pneumonia, as above.  Overall he appears relatively euvolemic, and consequently, will resume home Lasix at this time.  Plan: Check urinalysis with microscopy.  Add on random urine sodium as well as random urine creatinine.  Monitor strict I's and O's and daily weights.  Avoid nephrotoxic  agents.  Repeat BMP in the morning.         #) Paroxysmal atrial fibrillation: Documented history of such. In setting of CHA2DS2-VASc score of 4, there is an indication for chronic anticoagulation for thromboembolic prophylaxis. Consistent with this, patient is chronically anticoagulated on Eliquis. Home AV nodal blocking regimen: Coreg.  He is also on amiodarone as an outpatient most recent echocardiogram was performed December 2022, with LVEF 20 to 25% at the time. Presenting EKG demonstrated rate controlled atrial fibrillation without overt evidence of acute ischemic changes..    Plan: monitor strict I's & O's and daily weights. Repeat BMP/CBC in AM. Check serum mag level. Continue home AV nodal blocking regimen.  Continue outpatient Eliquis and amiodarone.        #) Chronic combined systolic and diastolic heart failure: documented history of such, with most recent echocardiogram performed in December 2022 notable for LVEF 20 to 25%, as above.  No clinical evidence to suggest acutely decompensated heart failure at this time.  Patient's home diuretic regimen reportedly consists of the following: Lasix 40 mg p.o. daily. Home cardiac medications also include the following: Coreg.empagliflozin .  Plan: monitor strict I's & O's and daily weights. Repeat BMP in the morning. Check serum magnesium level. Continue home diuretic regimen. Continue home Coreg.empagliflozin .        #) Hyperlipidemia: documented h/o such. On high intensity atorvastatin as outpatient.    Plan: continue home statin.        DVT prophylaxis: SCD's plus Eliquis Code Status: Full code Family Communication: none Disposition Plan: Per Rounding Team Consults called: none;  Admission status: Inpatient   PLEASE NOTE THAT DRAGON DICTATION SOFTWARE WAS USED IN THE CONSTRUCTION OF THIS NOTE.   [place] DO [facility] [place]   03/21/2021, 6:28 AM

## 2021-03-21 NOTE — Progress Notes (Addendum)
PROGRESS NOTE    William Henderson  [id number] DOB: 1965/07/12 DOA: 03/20/2021 PCP: Candi Leash, PA-C  Chief Complaint  Patient presents with   Cough    Brief Narrative:  William Henderson is William Henderson 56 y.o. male with medical history significant for chronic combined systolic/diastolic heart failure, hypertension, hyperlipidemia, paroxysmal atrial fibrillation chronically anticoagulated on Eliquis, stage IIIb chronic kidney disease with baseline creatinine 1.9-2.0, who is admitted to [facility] on 03/20/2021 with suspected community-acquired pneumonia after presenting from home to [facility] ED complaining of shortness of breath.     Assessment & Plan:   Principal Problem:   CAP (community acquired pneumonia) Active Problems:   Acute respiratory failure with hypoxia ([place])   Acute renal failure superimposed on stage 3b chronic kidney disease (HCC)   Paroxysmal atrial fibrillation (HCC)   Chronic combined systolic and diastolic heart failure (HCC)   TIA (transient ischemic attack)   History of cardiac arrest   Hyperlipidemia LDL goal <70   Hypokalemia   Assessment and Plan: * CAP (community acquired pneumonia)- (present on admission) Currently on 2 L CXR with patchy right upper lobe airspace disease abutting the minor fissure, c/w bronchopneumonia Continue levaquin given cephalosporin allergy (will ask pharmacy to look into this) Addendum: allergy to cephalexin 5-6 years ago, appears to have tolerated augmentin in 2020 and omnicef in 2019.  Also appears to have had ceftriaxone and fortaz in 04/2018 hospitalization.  Will switch to ceftriaxone/doxy with his tolerance of cephalosporins/penicillins. Negative urine strep.  Legionella pending.  Sputum cx pending. Blood cx pending  Acute respiratory failure with hypoxia (HCC) Due to pneumonia, follow  Acute renal failure superimposed on stage 3b chronic kidney disease ([place])- (present on admission) Baseline appears to be around 1.9 Improved,  follow  Paroxysmal atrial fibrillation ([place])- (present on admission) Continue eliquis, coreg, amiodarone  Chronic combined systolic and diastolic heart failure ([place])- (present on admission) Echo 01/2021 with EF 20-25% Coreg, lasix.  Jardiance currently on hold. Looks like he follows with cardiology at [facility], not clear when he last saw them  TIA (transient ischemic attack)- (present on admission) Continue eliquis, lipitor  History of cardiac arrest Amiodarone S/p ICD   DVT prophylaxis: eliquis Code Status: full Family Communication: none Disposition:   Status is: Inpatient Remains inpatient appropriate because: need for IV abx    Consultants:  none  Procedures:  none  Antimicrobials:  Anti-infectives (From admission, onward)    Start     Dose/Rate Route Frequency Ordered Stop   03/22/21 2200  levofloxacin (LEVAQUIN) IVPB 750 mg  Status:  Discontinued        750 mg 100 mL/hr over 90 Minutes Intravenous Every 48 hours 03/21/21 0023 03/21/21 1302   03/21/21 2200  levofloxacin (LEVAQUIN) IVPB 750 mg  Status:  Discontinued        750 mg 100 mL/hr over 90 Minutes Intravenous Every 24 hours 03/21/21 1302 03/21/21 2030   03/21/21 2200  cefTRIAXone (ROCEPHIN) 2 g in sodium chloride 0.9 % 100 mL IVPB        2 g 200 mL/hr over 30 Minutes Intravenous Every 24 hours 03/21/21 2029     03/21/21 2200  doxycycline (VIBRAMYCIN) 100 mg in sodium chloride 0.9 % 250 mL IVPB        100 mg 125 mL/hr over 120 Minutes Intravenous Every 12 hours 03/21/21 2031     03/20/21 2215  levofloxacin (LEVAQUIN) IVPB 750 mg        750 mg 100 mL/hr over 90 Minutes  Intravenous  Once 03/20/21 2207 03/21/21 0051       Subjective: Feels better, was noting SOB with exertion prior to presentation  Objective: Vitals:   03/21/21 1400 03/21/21 1500 03/21/21 1711 03/21/21 1829  BP: (!) 128/102 (!) 116/93 120/89 (!) 119/92  Pulse: 76 67 70 74  Resp: 17 17 19  (!) 22  Temp:   98.5 F (36.9 C) 98 F  (36.7 C)  TempSrc:    Oral  SpO2: 93% 95% 96% 96%  Weight:    121.6 kg  Height:    5\' 10"  (1.778 m)    Intake/Output Summary (Last 24 hours) at 03/21/2021 2103 Last data filed at 03/21/2021 1350 Gross per 24 hour  Intake --  Output 150 ml  Net -150 ml   Filed Weights   03/20/21 2053 03/21/21 1829  Weight: 117 kg 121.6 kg    Examination:  General exam: Appears calm and comfortable  Respiratory system: unlabored on 2-3 L Cardiovascular system: RRR Gastrointestinal system: Abdomen is nondistended, soft and nontender Central nervous system: Alert and oriented. No focal neurological deficits. Extremities: no LEE Skin: No rashes, lesions or ulcers Psychiatry: Judgement and insight appear normal. Mood & affect appropriate.     Data Reviewed: I have personally reviewed following labs and imaging studies  CBC: Recent Labs  Lab 03/20/21 2248 03/21/21 0445  WBC 8.7 8.5  NEUTROABS 6.1 6.2  HGB 14.8 15.1  HCT 44.7 45.9  MCV 91.2 92.0  PLT 179 167    Basic Metabolic Panel: Recent Labs  Lab 03/20/21 2248 03/21/21 0445  NA 137 136  K 2.7* 3.1*  CL 101 101  CO2 27 27  GLUCOSE 104* 110*  BUN 17 18  CREATININE 2.29* 2.01*  CALCIUM 8.6* 8.3*  MG  --  1.8  PHOS  --  5.0*    GFR: Estimated Creatinine Clearance: 53.6 mL/min (William Henderson) (by C-G formula based on SCr of 2.01 mg/dL (H)).  Liver Function Tests: Recent Labs  Lab 03/21/21 0445  AST 32  ALT 21  ALKPHOS 47  BILITOT 0.8  PROT 7.9  ALBUMIN 3.7    CBG: No results for input(s): GLUCAP in the last 168 hours.   Recent Results (from the past 240 hour(s))  Resp Panel by RT-PCR (Flu William Henderson&B, Covid) Nasopharyngeal Swab     Status: None   Collection Time: 03/20/21  9:00 PM   Specimen: Nasopharyngeal Swab; Nasopharyngeal(NP) swabs in vial transport medium  Result Value Ref Range Status   SARS Coronavirus 2 by RT PCR NEGATIVE NEGATIVE Final    Comment: (NOTE) SARS-CoV-2 target nucleic acids are NOT DETECTED.  The  SARS-CoV-2 RNA is generally detectable in upper respiratory specimens during the acute phase of infection. The lowest concentration of SARS-CoV-2 viral copies this assay can detect is 138 copies/mL. William Henderson negative result does not preclude SARS-Cov-2 infection and should not be used as the sole basis for treatment or other patient management decisions. William Henderson negative result may occur with  improper specimen collection/handling, submission of specimen other than nasopharyngeal swab, presence of viral mutation(s) within the areas targeted by this assay, and inadequate number of viral copies(<138 copies/mL). William Henderson negative result must be combined with clinical observations, patient history, and epidemiological information. The expected result is Negative.  Fact Sheet for Patients:  BloggerCourse.com  Fact Sheet for Healthcare Providers:  SeriousBroker.it  This test is no t yet approved or cleared by the [place] FDA and  has been authorized for detection and/or diagnosis of SARS-CoV-2 by  FDA under an Emergency Use Authorization (EUA). This EUA will remain  in effect (meaning this test can be used) for the duration of the COVID-19 declaration under Section 564(b)(1) of the Act, 21 U.S.C.section 360bbb-3(b)(1), unless the authorization is terminated  or revoked sooner.       Influenza William Henderson by PCR NEGATIVE NEGATIVE Final   Influenza B by PCR NEGATIVE NEGATIVE Final    Comment: (NOTE) The Xpert Xpress SARS-CoV-2/FLU/RSV plus assay is intended as an aid in the diagnosis of influenza from Nasopharyngeal swab specimens and should not be used as William Henderson sole basis for treatment. Nasal washings and aspirates are unacceptable for Xpert Xpress SARS-CoV-2/FLU/RSV testing.  Fact Sheet for Patients: EntrepreneurPulse.com.au  Fact Sheet for Healthcare Providers: IncredibleEmployment.be  This test is not yet approved or  cleared by the [place] FDA and has been authorized for detection and/or diagnosis of SARS-CoV-2 by FDA under an Emergency Use Authorization (EUA). This EUA will remain in effect (meaning this test can be used) for the duration of the COVID-19 declaration under Section 564(b)(1) of the Act, 21 U.S.C. section 360bbb-3(b)(1), unless the authorization is terminated or revoked.  Performed at [facility], [place] [address] [place], [place] [address]          Radiology Studies: DG Chest 2 View  Result Date: 03/20/2021 CLINICAL DATA:  Cough, short of breath for 2 weeks EXAM: CHEST - 2 VIEW COMPARISON:  01/24/2021 FINDINGS: Frontal and lateral views of the chest demonstrate Anais Koenen single lead pacer/AICD. Stable enlargement of the cardiac silhouette. There is patchy right upper lobe airspace disease abutting the minor fissure. No effusion or pneumothorax. No acute bony abnormalities. IMPRESSION: 1. Patchy right upper lobe airspace disease abutting the minor fissure, consistent with bronchopneumonia. 2. Stable enlarged cardiac silhouette. Electronically Signed   By: Randa Ngo M.D.   On: 03/20/2021 21:24        Scheduled Meds:  amiodarone  200 mg Oral QHS   amLODipine  10 mg Oral QHS   apixaban  5 mg Oral BID   atorvastatin  80 mg Oral Daily   carvedilol  25 mg Oral BID WC   furosemide  40 mg Oral Daily   Continuous Infusions:  cefTRIAXone (ROCEPHIN)  IV     doxycycline (VIBRAMYCIN) IV       LOS: 0 days    Time spent: over 30 min    Fayrene Helper, MD [facility]   To contact the attending provider between 7A-7P or the covering provider during after hours 7P-7A, please log into the web site [email or web address] and access using universal [place] password for that web site. If you do not have the password, please call the hospital operator.  03/21/2021, 9:03 PM

## 2021-03-21 NOTE — Assessment & Plan Note (Addendum)
Baseline around 1.9. Mildly elevated on admission up to 2.29 but trended back toward baseline by discharge.

## 2021-03-22 ENCOUNTER — Ambulatory Visit: Payer: Medicare Other

## 2021-03-22 DIAGNOSIS — J9601 Acute respiratory failure with hypoxia: Secondary | ICD-10-CM

## 2021-03-22 LAB — EXPECTORATED SPUTUM ASSESSMENT W GRAM STAIN, RFLX TO RESP C

## 2021-03-22 LAB — POTASSIUM: Potassium: 3.2 mmol/L — ABNORMAL LOW (ref 3.5–5.1)

## 2021-03-22 LAB — MAGNESIUM: Magnesium: 1.9 mg/dL (ref 1.7–2.4)

## 2021-03-22 MED ORDER — POTASSIUM CHLORIDE CRYS ER 20 MEQ PO TBCR: 40.0000 meq | EXTENDED_RELEASE_TABLET | Freq: Every day | ORAL | 0 refills | Status: AC

## 2021-03-22 MED ORDER — HYDROCOD POLI-CHLORPHE POLI ER 10-8 MG/5ML PO SUER: 5.0000 mL | Freq: Every evening | ORAL | 0 refills | Status: AC | PRN

## 2021-03-22 MED ORDER — POTASSIUM CHLORIDE CRYS ER 20 MEQ PO TBCR
40.0000 meq | EXTENDED_RELEASE_TABLET | Freq: Once | ORAL | Status: AC
  Administered 2021-03-22: 40 meq via ORAL
  Filled 2021-03-22: qty 2

## 2021-03-22 MED ORDER — DOXYCYCLINE HYCLATE 100 MG PO CAPS: 100.0000 mg | ORAL_CAPSULE | Freq: Two times a day (BID) | ORAL | 0 refills | Status: AC

## 2021-03-22 MED ORDER — CEFPODOXIME PROXETIL 200 MG PO TABS: 200.0000 mg | ORAL_TABLET | Freq: Two times a day (BID) | ORAL | 0 refills | Status: AC

## 2021-03-22 NOTE — Progress Notes (Signed)
SATURATION QUALIFICATIONS: (This note is used to comply with regulatory documentation for home oxygen)  Patient Saturations on Room Air at Rest = 93%  Patient Saturations on Room Air while Ambulating = 90%  Patient Saturations on 0 Liters of oxygen while Ambulating = 90%  Please briefly explain why patient needs home oxygen:  Pt fluctuated between 90-94% while ambulating. Pt quickly recovered to 94% at rest. [place]

## 2021-03-22 NOTE — Assessment & Plan Note (Signed)
-  Continue Lipitor °

## 2021-03-22 NOTE — Discharge Summary (Signed)
Physician Discharge Summary   Patient: William Henderson MRN: [id number] DOB: 05/01/1965  Admit date:     03/20/2021  Discharge date: 03/22/21  Discharge Physician: Cordelia Poche, MD   PCP: Candi Leash, PA-C   Recommendations at discharge:   PCP follow-up Repeat chest x-ray in 2-3 weeks  Discharge Diagnoses: Principal Problem:   CAP (community acquired pneumonia) Active Problems:   TIA (transient ischemic attack)   Paroxysmal atrial fibrillation (HCC)   Hyperlipidemia LDL goal <70   Hypokalemia   Acute renal failure superimposed on stage 3b chronic kidney disease ([place])   Chronic combined systolic and diastolic heart failure ([place])   History of cardiac arrest  Resolved Problems:   Acute respiratory failure with hypoxia [facility])   Hospital Course: Majesty Kis is a 56 y.o. male with medical history significant for chronic combined systolic/diastolic heart failure, hypertension, hyperlipidemia, paroxysmal atrial fibrillation chronically anticoagulated on Eliquis, stage IIIb chronic kidney disease with baseline creatinine 1.9-2.0, who was admitted to [facility] on 03/20/2021 with suspected community-acquired pneumonia after presenting from home to [facility] ED complaining of shortness of breath. He was started on empiric antibiotic treatment with improvement of symptoms. Patient discharged to continue antibiotic treatment.  Assessment and Plan: * CAP (community acquired pneumonia)- (present on admission) Right upper lobe airspace disease consistent with bronchopneumonia. Patient initially started on treatment with Levaquin which was transitioned to Ceftriaxone and Doxycycline. Patient tolerated this modified dose with no allergic reaction. Patient transitioned to [facility] and doxycycline on discharge.  History of cardiac arrest S/p ICD.  Chronic combined systolic and diastolic heart failure ([place])- (present on admission) Stable. Continue Coreg, Lasix, Jardiance.  Acute renal failure  superimposed on stage 3b chronic kidney disease ([place])- (present on admission) Baseline around 1.9. Mildly elevated on admission up to 2.29 but trended back toward baseline by discharge.  Hyperlipidemia LDL goal <70- (present on admission) Continue Lipitor.  Paroxysmal atrial fibrillation ([place])- (present on admission) Continue Eliquis, Coreg and Amiodarone.  TIA (transient ischemic attack)- (present on admission) History of. Continue eliquis, lipitor.  Acute respiratory failure with hypoxia (HCC)-resolved as of 03/22/2021 Secondary to pneumonia. Oxygen use weaned to room air. Patient ambulated without oxygen and did not qualify for home oxygen use.           Consultants: None Procedures performed: None  Disposition: Home Diet recommendation:  Cardiac diet  DISCHARGE MEDICATION: Allergies as of 03/22/2021       Reactions   Cephalexin Hives   Has taken cefdinir in 2019 and Augmentin in 2020 with no issues.        Medication List     TAKE these medications    amLODipine 10 MG tablet Commonly known as: NORVASC Take 10 mg by mouth at bedtime.   apixaban 2.5 MG Tabs tablet Commonly known as: ELIQUIS Take 2.5 mg by mouth 2 (two) times daily. What changed: Another medication with the same name was removed. Continue taking this medication, and follow the directions you see here.   atorvastatin 80 MG tablet Commonly known as: LIPITOR Take 1 tablet (80 mg total) by mouth daily.   carvedilol 25 MG tablet Commonly known as: COREG Take 1 tablet (25 mg total) by mouth 2 (two) times daily with a meal.   cefpodoxime 200 MG tablet Commonly known as: VANTIN Take 1 tablet (200 mg total) by mouth 2 (two) times daily for 4 days.   chlorpheniramine-HYDROcodone 10-8 MG/5ML Commonly known as: Tussionex Pennkinetic ER Take 5 mLs by mouth at bedtime as needed  for up to 5 days for cough.   doxycycline 100 MG capsule Commonly known as: VIBRAMYCIN Take 1 capsule (100 mg total)  by mouth 2 (two) times daily for 4 days.   empagliflozin 10 MG Tabs tablet Commonly known as: JARDIANCE Take 10 mg by mouth daily.   furosemide 40 MG tablet Commonly known as: LASIX Take 40 mg by mouth in the morning and at bedtime.   Pacerone 200 MG tablet Generic drug: amiodarone Take 200 mg by mouth at bedtime.   potassium chloride SA 20 MEQ tablet Commonly known as: KLOR-CON M Take 2 tablets (40 mEq total) by mouth daily for 2 days.        Follow-up Information     Candi Leash, PA-C. Schedule an appointment as soon as possible for a visit in 1 week(s).   Specialty: Physician Assistant Why: For hospital follow-up Contact information: [place] [address] [phone]                 Discharge Exam: Danley Danker Weights   03/20/21 2053 03/21/21 1829  Weight: 117 kg 121.6 kg   General exam: Appears calm and comfortable Respiratory system: Diffuse mild wheezing/rhonchi. Respiratory effort normal. Cardiovascular system: S1 & S2 heard, RRR. Gastrointestinal system: Abdomen is nondistended, soft and nontender. No organomegaly or masses felt. Normal bowel sounds heard. Central nervous system: Alert and oriented. No focal neurological deficits. Musculoskeletal: No edema. No calf tenderness Skin: No cyanosis. No rashes Psychiatry: Judgement and insight appear normal. Mood & affect appropriate.   Condition at discharge: stable  The results of significant diagnostics from this hospitalization (including imaging, microbiology, ancillary and laboratory) are listed below for reference.   Imaging Studies: DG Chest 2 View  Result Date: 03/20/2021 CLINICAL DATA:  Cough, short of breath for 2 weeks EXAM: CHEST - 2 VIEW COMPARISON:  01/24/2021 FINDINGS: Frontal and lateral views of the chest demonstrate a single lead pacer/AICD. Stable enlargement of the cardiac silhouette. There is patchy right upper lobe airspace disease abutting the minor fissure. No  effusion or pneumothorax. No acute bony abnormalities. IMPRESSION: 1. Patchy right upper lobe airspace disease abutting the minor fissure, consistent with bronchopneumonia. 2. Stable enlarged cardiac silhouette. Electronically Signed   By: Randa Ngo M.D.   On: 03/20/2021 21:24    Microbiology: Results for orders placed or performed during the hospital encounter of 03/20/21  Resp Panel by RT-PCR (Flu A&B, Covid) Nasopharyngeal Swab     Status: None   Collection Time: 03/20/21  9:00 PM   Specimen: Nasopharyngeal Swab; Nasopharyngeal(NP) swabs in vial transport medium  Result Value Ref Range Status   SARS Coronavirus 2 by RT PCR NEGATIVE NEGATIVE Final    Comment: (NOTE) SARS-CoV-2 target nucleic acids are NOT DETECTED.  The SARS-CoV-2 RNA is generally detectable in upper respiratory specimens during the acute phase of infection. The lowest concentration of SARS-CoV-2 viral copies this assay can detect is 138 copies/mL. A negative result does not preclude SARS-Cov-2 infection and should not be used as the sole basis for treatment or other patient management decisions. A negative result may occur with  improper specimen collection/handling, submission of specimen other than nasopharyngeal swab, presence of viral mutation(s) within the areas targeted by this assay, and inadequate number of viral copies(<138 copies/mL). A negative result must be combined with clinical observations, patient history, and epidemiological information. The expected result is Negative.  Fact Sheet for Patients:  EntrepreneurPulse.com.au  Fact Sheet for Healthcare Providers:  IncredibleEmployment.be  This test is no t yet approved or cleared by the [place] and  has been authorized for detection and/or diagnosis of SARS-CoV-2 by FDA under an Emergency Use Authorization (EUA). This EUA will remain  in effect (meaning this test can be used) for the duration of  the COVID-19 declaration under Section 564(b)(1) of the Act, 21 U.S.C.section 360bbb-3(b)(1), unless the authorization is terminated  or revoked sooner.       Influenza A by PCR NEGATIVE NEGATIVE Final   Influenza B by PCR NEGATIVE NEGATIVE Final    Comment: (NOTE) The Xpert Xpress SARS-CoV-2/FLU/RSV plus assay is intended as an aid in the diagnosis of influenza from Nasopharyngeal swab specimens and should not be used as a sole basis for treatment. Nasal washings and aspirates are unacceptable for Xpert Xpress SARS-CoV-2/FLU/RSV testing.  Fact Sheet for Patients: EntrepreneurPulse.com.au  Fact Sheet for Healthcare Providers: IncredibleEmployment.be  This test is not yet approved or cleared by the [place] FDA and has been authorized for detection and/or diagnosis of SARS-CoV-2 by FDA under an Emergency Use Authorization (EUA). This EUA will remain in effect (meaning this test can be used) for the duration of the COVID-19 declaration under Section 564(b)(1) of the Act, 21 U.S.C. section 360bbb-3(b)(1), unless the authorization is terminated or revoked.  Performed at [facility], [place] [address] [place], [place] [address]   Culture, blood (Routine X 2) w Reflex to ID Panel     Status: None (Preliminary result)   Collection Time: 03/20/21 10:48 PM   Specimen: BLOOD  Result Value Ref Range Status   Specimen Description   Final    BLOOD BLOOD RIGHT HAND Performed at [place] [address] [place], [place] [address]    Special Requests   Final    BOTTLES DRAWN AEROBIC AND ANAEROBIC Blood Culture adequate volume Performed at [facility] [address] [place], [place] [address]    Culture   Final    NO GROWTH 1 DAY Performed at [facility], [place] [address], [place], [place] [address]    Report Status PENDING  Incomplete  Culture, blood (Routine X 2) w Reflex to  ID Panel     Status: None (Preliminary result)   Collection Time: 03/20/21 10:48 PM   Specimen: BLOOD  Result Value Ref Range Status   Specimen Description   Final    BLOOD BLOOD LEFT HAND Performed at [facility] [address], [place], [place] [address]    Special Requests   Final    BOTTLES DRAWN AEROBIC AND ANAEROBIC Blood Culture adequate volume Performed at [facility] [address], [place], [place] [address]    Culture   Final    NO GROWTH 1 DAY Performed at [facility], [place] [address], [place], [place] [address]    Report Status PENDING  Incomplete  Expectorated Sputum Assessment w Gram Stain, Rflx to Resp Cult     Status: None   Collection Time: 03/22/21  6:28 AM   Specimen: Expectorated Sputum  Result Value Ref Range Status   Specimen Description EXPECTORATED SPUTUM  Final   Special Requests NONE  Final   Sputum evaluation   Final    THIS SPECIMEN IS ACCEPTABLE FOR SPUTUM CULTURE Performed at [facility], [place] [address], [place], [place] [address]    Report Status 03/22/2021 FINAL  Final    Labs: CBC: Recent Labs  Lab 03/20/21  2248 03/21/21 0445  WBC 8.7 8.5  NEUTROABS 6.1 6.2  HGB 14.8 15.1  HCT 44.7 45.9  MCV 91.2 92.0  PLT 179 [id number]   Basic Metabolic Panel: Recent Labs  Lab 03/20/21 2248 03/21/21 0445 03/22/21 0907  NA 137 136  --   K 2.7* 3.1* 3.2*  CL 101 101  --   CO2 27 27  --   GLUCOSE 104* 110*  --   BUN 17 18  --   CREATININE 2.29* 2.01*  --   CALCIUM 8.6* 8.3*  --   MG  --  1.8 1.9  PHOS  --  5.0*  --    Liver Function Tests: Recent Labs  Lab 03/21/21 0445  AST 32  ALT 21  ALKPHOS 47  BILITOT 0.8  PROT 7.9  ALBUMIN 3.7   CBG: No results for input(s): GLUCAP in the last 168 hours.  Discharge time spent: 35 minutes.  Signed: Cordelia Poche, MD [facility] 03/22/2021

## 2021-03-22 NOTE — Discharge Instructions (Signed)
William Henderson,  You were in the hospital with pneumonia and have improved with antibiotics. Please follow-up with your primary care physician and continue your antibiotics as prescribed.

## 2021-03-22 NOTE — TOC Transition Note (Signed)
Transition of Care [facility]) - CM/SW Discharge Note   Patient Details  Name: William Henderson MRN: [id number] Date of Birth: 06/26/1965  [facility]) CM/SW Contact:  Golda Acre, RN Phone Number: 03/22/2021, 2:29 PM   Clinical Narrative:    Dcd to home with no toc needs   Final next level of care: Home/Self Care Barriers to Discharge: Barriers Resolved   Patient Goals and CMS Choice Patient states their goals for this hospitalization and ongoing recovery are:: to go home CMS Medicare.gov Compare Post Acute Care list provided to:: Patient    Discharge Placement                       Discharge Plan and Services   Discharge Planning Services: CM Consult                                 Social Determinants of Health (SDOH) Interventions     Readmission Risk Interventions No flowsheet data found.

## 2021-03-22 NOTE — Plan of Care (Signed)
  Problem: Clinical Measurements: Goal: Respiratory complications will improve Outcome: Progressing   Problem: Activity: Goal: Risk for activity intolerance will decrease Outcome: Progressing   

## 2021-03-22 NOTE — TOC Initial Note (Signed)
Transition of Care [facility]) - Initial/Assessment Note    Patient Details  Name: William Henderson MRN: [id number] Date of Birth: 1965-12-21  Transition of Care [facility]) CM/SW Contact:    Leeroy Cha, RN Phone Number: 03/22/2021, 8:16 AM  Clinical Narrative:                  Transition of Care [facility]) Screening Note   Patient Details  Name: William Henderson Date of Birth: 11-15-1965   Transition of Care [facility]) CM/SW Contact:    Leeroy Cha, RN Phone Number: 03/22/2021, 8:16 AM    Transition of Care Department [facility]) has reviewed patient and no TOC needs have been identified at this time. We will continue to monitor patient advancement through interdisciplinary progression rounds. If new patient transition needs arise, please place a TOC consult.    Expected Discharge Plan: Home/Self Care Barriers to Discharge: Continued Medical Work up   Patient Goals and CMS Choice Patient states their goals for this hospitalization and ongoing recovery are:: to go home CMS Medicare.gov Compare Post Acute Care list provided to:: Patient    Expected Discharge Plan and Services Expected Discharge Plan: Home/Self Care   Discharge Planning Services: CM Consult   Living arrangements for the past 2 months: Single Family Home                                      Prior Living Arrangements/Services Living arrangements for the past 2 months: Single Family Home Lives with:: Self Patient language and need for interpreter reviewed:: Yes Do you feel safe going back to the place where you live?: Yes            Criminal Activity/Legal Involvement Pertinent to Current Situation/Hospitalization: No - Comment as needed  Activities of Daily Living Home Assistive Devices/Equipment: None ADL Screening (condition at time of admission) Patient's cognitive ability adequate to safely complete daily activities?: Yes Is the patient deaf or have difficulty hearing?: No Does the patient have  difficulty seeing, even when wearing glasses/contacts?: No Does the patient have difficulty concentrating, remembering, or making decisions?: No Patient able to express need for assistance with ADLs?: No Does the patient have difficulty dressing or bathing?: No Independently performs ADLs?: Yes (appropriate for developmental age) Does the patient have difficulty walking or climbing stairs?: No Weakness of Legs: None Weakness of Arms/Hands: None  Permission Sought/Granted                  Emotional Assessment Appearance:: Appears stated age     Orientation: : Oriented to Self, Oriented to Place, Oriented to  Time, Oriented to Situation Alcohol / Substance Use: Not Applicable Psych Involvement: No (comment)  Admission diagnosis:  CAP (community acquired pneumonia) [J18.9] Community acquired pneumonia, unspecified laterality [J18.9] Patient Active Problem List   Diagnosis Date Noted   CAP (community acquired pneumonia) 03/21/2021   Hypokalemia 03/21/2021   Acute renal failure superimposed on stage 3b chronic kidney disease ([place]) 03/21/2021   Chronic combined systolic and diastolic heart failure ([place]) 03/21/2021   Acute respiratory failure with hypoxia ([place]) 03/21/2021   History of cardiac arrest 03/21/2021   TIA (transient ischemic attack) 01/25/2021   AKI (acute kidney injury) ([place]) 01/25/2021   Paroxysmal atrial fibrillation ([place]) 01/25/2021   Hypertension 01/25/2021   Hyperlipidemia LDL goal <70 01/25/2021   PCP:  Candi Leash, PA-C Pharmacy:   [place] ([place]),  [place] [id number] [place] [address] Phone: [phone] Fax: [phone]     Social Determinants of Health (SDOH) Interventions    Readmission Risk Interventions No flowsheet data found.

## 2021-03-23 LAB — LEGIONELLA PNEUMOPHILA SEROGP 1 UR AG: L. pneumophila Serogp 1 Ur Ag: NEGATIVE

## 2021-03-24 ENCOUNTER — Ambulatory Visit: Payer: Medicare Other | Admitting: Occupational Therapy

## 2021-03-24 ENCOUNTER — Ambulatory Visit: Payer: Medicare Other

## 2021-03-26 LAB — CULTURE, BLOOD (ROUTINE X 2)
Culture: NO GROWTH
Culture: NO GROWTH
Special Requests: ADEQUATE
Special Requests: ADEQUATE

## 2021-03-26 LAB — CULTURE, RESPIRATORY W GRAM STAIN: Culture: NORMAL

## 2021-03-27 ENCOUNTER — Ambulatory Visit: Payer: Medicare Other

## 2021-03-27 ENCOUNTER — Ambulatory Visit: Payer: Medicare Other | Admitting: Occupational Therapy

## 2021-03-28 ENCOUNTER — Telehealth: Payer: Self-pay | Admitting: Occupational Therapy

## 2021-03-28 NOTE — Telephone Encounter (Signed)
Good afternoon Dr. Chales Abrahams,  William Henderson is being treated by OT for decreased endurance s/p CVA.  Pt repots everyday activities are difficult.  Pt repots he fatigues quickly and sometimes with SOB and lightheadedness during functional tasks.  He did not have any true weakness s/p CVA on evaluation for OT.  I am curious if it may be more cardiac in nature.  He did mention that he had been given limitation in regards to use of weights and increased endurance activities.  Could you clarify if he has any specific limitations from a cardiac standpoint as I would like to incorporate resistance, weights, and increased intensity as able.    He may also benefit from Cardiac rehab when he is more stable from an OT/PT standpoint.    Thank you,  Rosalio Loud, OTR/L

## 2021-03-29 ENCOUNTER — Ambulatory Visit: Payer: Medicare Other

## 2021-03-31 ENCOUNTER — Ambulatory Visit: Payer: Medicare Other

## 2021-03-31 ENCOUNTER — Other Ambulatory Visit: Payer: Self-pay

## 2021-03-31 DIAGNOSIS — R2681 Unsteadiness on feet: Secondary | ICD-10-CM | POA: Diagnosis present

## 2021-03-31 DIAGNOSIS — M6281 Muscle weakness (generalized): Secondary | ICD-10-CM | POA: Diagnosis not present

## 2021-03-31 DIAGNOSIS — R2689 Other abnormalities of gait and mobility: Secondary | ICD-10-CM

## 2021-03-31 NOTE — Therapy (Signed)
[place] [facility] [address], [address] [place], [place], [address] Phone: [phone]   Fax:  [phone]  Physical Therapy Treatment  Patient Details  Name: William Henderson MRN: [id number] Date of Birth: 01-22-1966 Referring Provider (PT): Elder Negus, PA-C   Encounter Date: 03/31/2021   PT End of Session - 03/31/21 0850     Visit Number 4    Number of Visits 17    Date for PT Re-Evaluation 05/05/21    Authorization Type Medicare/Medicaid    Progress Note Due on Visit 10    PT Start Time 0845    PT Stop Time 0930    PT Time Calculation (min) 45 min    Activity Tolerance Patient limited by fatigue    Behavior During Therapy [facility] for tasks assessed/performed             Past Medical History:  Diagnosis Date   Cardiac arrest (HCC)    CHF (congestive heart failure) (HCC)    Hypertension    Nonischemic cardiomyopathy (HCC)    Paroxysmal atrial fibrillation (HCC)    Renal disorder     Past Surgical History:  Procedure Laterality Date   CARDIAC DEFIBRILLATOR PLACEMENT      There were no vitals filed for this visit.   Subjective Assessment - 03/31/21 0849     Subjective Returns after hospitalization related to pna and reports feeling fatigued and difficulty in focusing this morning. Vital signs reveal 113/81 and 52 bpm. Pt reports he recently got a dog and has been walking more and has not noticed any undue SOB with activity    Currently in Pain? No/denies    Pain Score 0-No pain                               [facility] Adult PT Treatment/Exercise - 03/31/21 0001       Neuro Re-ed    Neuro Re-ed Details  Patient reports feeling of positional dizziness during treatment session and feeling of "off balance". Performed ocular exam which reveals normal smooth pursuits, saccades, and convergence w/out symptoms. Continued with Horizontal roll test without nystagmus or symptoms. Then, performed Dix-Hallpike left/right  without symptom provocation      Knee/Hip Exercises: Aerobic   Nustep level 6 x 5 min to improve activity tolerance      Knee/Hip Exercises: Standing   Lateral Step Up Both;2 sets;10 reps;Step Height: 6"    Forward Step Up Both;2 sets;10 reps;Hand Hold: 2;Step Height: 6"      Knee/Hip Exercises: Seated   Long Arc Quad Strengthening;Both;3 sets;10 reps    Other Seated Knee/Hip Exercises AP 3x10    Sit to Sand 2 sets;10 reps;without UE support                       PT Short Term Goals - 03/07/21 1352       PT SHORT TERM GOAL #1   Title Pt will be independent with HEP for improved strength, balance, transfers, and gait.  TARGET 04/07/2021    Time 5    Period Weeks    Status New      PT SHORT TERM GOAL #2   Title Pt will improve 5x sit<>stand to less than or equal to 16 sec to demonstrate improved functional strength and transfer efficiency.    Baseline 22.22 sec    Time 5    Period Weeks  Status New      PT SHORT TERM GOAL #3   Title Pt will improve TUG score to less than or equal to 16 sec for decreased fall risk.    Baseline 21.06 sec    Time 5    Period Weeks    Status New      PT SHORT TERM GOAL #4   Title Pt will verbalize understanding of fall prevention in home environment.    Time 5    Period Weeks    Status New               PT Long Term Goals - 03/07/21 1354       PT LONG TERM GOAL #1   Title Pt will be independent with progression of HEP for improved strength, balance, transfers, and gait.  TARGET 05/05/2021    Time 9    Period Weeks    Status New      PT LONG TERM GOAL #2   Title Pt will improve 5x sit<>stand to less than or equal to 11.5 sec to demonstrate improved functional strength and transfer efficiency.    Time 9    Period Weeks    Status New      PT LONG TERM GOAL #3   Title Pt will improve TUG score to less than or equal to 13.5 sec for decreased fall risk.    Time 9    Period Weeks    Status New      PT LONG TERM  GOAL #4   Title Pt will improve gait velocity to at least 2.3 ft/sec for improved gait efficiency and safety.    Baseline 1.5 ft/sec    Time 9    Period Weeks    Status New      PT LONG TERM GOAL #5   Title Further Gait and balance testing (FGA/DGI and/or 6MWT) to be assessed/goal written as appropriate.    Time 9    Period Weeks    Status New                   Plan - 03/31/21 0934     Clinical Impression Statement Pt returns to clinic following absence related to recent hospitalization from pna and reports overall feeling better other than today where he feels "off balance" and increasingly fatigued. Demonstrates decrease in exercise tolerance requiring more frequent rest periods and became lightheaded during stepping activity with vital signs revealing 115/81 mmHg and 55 bpm and 98% O2 following activity. Denies any unilateral symptoms and does not manifest any asymmetry in this respect to strength, sensation, proprioception. Pt notes feeling of dizziness when looking up and therapist performed battery of vestibular assessments and no provocation with positioning testing or oculomotor testing. Pt advised to limit exertion and provided with information for obtaining BP monitor for at-home use to monitor symptoms during exertion    Personal Factors and Comorbidities Comorbidity 3+    Comorbidities PMH:  v fib arrest s/p ICD placement 2019, CVA 2017, a-fib, HTN, CKD    Examination-Activity Limitations Locomotion Level;Transfers;Stairs;Stand    Examination-Participation Restrictions Occupation;Community Activity;Driving    Stability/Clinical Decision Making Evolving/Moderate complexity    Rehab Potential Good    PT Frequency 2x / week    PT Duration 8 weeks   plus eval= 9 weeks POC   PT Treatment/Interventions ADLs/Self Care Home Management;Gait training;Stair training;Functional mobility training;Therapeutic activities;Therapeutic exercise;Balance training;Neuromuscular  re-education;Patient/family education    PT Next Visit Plan Review initial HEP,  continue to progress HEP for functional strength, gait training, balance.  May need to further assess DGI/FGA (unable to do at eval due to time constraints) or 6 MWT and set goals as appropriate.  Monitor vitals with activity in session.    PT Home Exercise Plan step up, lateral step ups, shadow boxing, sit to stand w/out UE    Consulted and Agree with Plan of Care Patient             Patient will benefit from skilled therapeutic intervention in order to improve the following deficits and impairments:  Abnormal gait, Difficulty walking, Decreased balance, Decreased activity tolerance, Decreased endurance, Decreased mobility, Decreased strength  Visit Diagnosis: Unsteadiness on feet  Muscle weakness (generalized)  Other abnormalities of gait and mobility     Problem List Patient Active Problem List   Diagnosis Date Noted   CAP (community acquired pneumonia) 03/21/2021   Hypokalemia 03/21/2021   Acute renal failure superimposed on stage 3b chronic kidney disease ([place]) 03/21/2021   Chronic combined systolic and diastolic heart failure ([place]) 03/21/2021   History of cardiac arrest 03/21/2021   TIA (transient ischemic attack) 01/25/2021   AKI (acute kidney injury) ([place]) 01/25/2021   Paroxysmal atrial fibrillation ([place]) 01/25/2021   Hypertension 01/25/2021   Hyperlipidemia LDL goal <70 01/25/2021    Toniann Fail, PT 03/31/2021, 9:39 AM  [facility] [address] [place], [place], [address] Phone: [phone]   Fax:  [phone]  Name: William Henderson MRN: [id number] Date of Birth: 05-27-1965

## 2021-04-05 ENCOUNTER — Ambulatory Visit: Payer: Medicare Other | Admitting: Physical Therapy

## 2021-04-05 ENCOUNTER — Ambulatory Visit: Payer: Medicare Other | Attending: Physician Assistant | Admitting: Occupational Therapy

## 2021-04-07 ENCOUNTER — Ambulatory Visit: Payer: Medicare Other | Admitting: Physical Therapy

## 2021-04-07 ENCOUNTER — Telehealth: Payer: Self-pay | Admitting: Physical Therapy

## 2021-04-07 NOTE — Telephone Encounter (Signed)
Left message regarding pt's two missed appointments for PT this week.  Today was pt's last scheduled appointment.  Requested pt call back to discuss possible discharge or any further PT needs. ? Lonia Blood, PT ?04/07/21 10:31 AM ?Phone: [phone] ?Fax: [phone] ? ?

## 2021-04-24 ENCOUNTER — Inpatient Hospital Stay: Payer: Medicare Other | Admitting: Neurology

## 2021-04-24 ENCOUNTER — Encounter: Payer: Self-pay | Admitting: Neurology

## 2021-05-02 ENCOUNTER — Encounter: Payer: Self-pay | Admitting: Occupational Therapy

## 2021-05-02 NOTE — Therapy (Signed)
[facility] [address] [place], [place], [address] Phone: [phone]   Fax:  [phone]  Patient Details  Name: William Henderson MRN: [id number] Date of Birth: 1965-06-11 Referring Provider:  No ref. provider found  Encounter Date: 05/02/2021  OCCUPATIONAL THERAPY DISCHARGE SUMMARY  Visits from Start of Care: 2  Current functional level related to goals / functional outcomes: Pt has not been seen by Occupational Therapy since 03/14/21 but upon last visit pt reporting lightheadedness with housekeeping tasks and simulated tasks in the clinic.  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  Pt has reported decreased activity and endurance s/p CVA and ICD placement.  Pt has been called by PT to discuss further therapy needs vs discharge with no return call.   Remaining deficits: Decreased activity tolerance, intermittent lightheadedness with household tasks, and decreased endurance.   Education / Equipment: Educated on [employer] and provided with 4 P's of energy conservation handout.   Patient agrees to discharge. Patient goals were not met. Patient is being discharged due to not returning since the last visit.Marland Kitchen     Simonne Come, OT 05/02/2021, 12:30 PM  [facility] [address], [place], [place], [address] Phone: [phone]   Fax:  [phone]

## 2021-12-14 NOTE — Therapy (Signed)
[place] [facility] [address] [place], [place], [address] Phone: [phone]   Fax:  [phone]  Patient Details  Name: William Henderson MRN: [id number] Date of Birth: December 27, 1965 Referring Provider:  No ref. provider found  Encounter Date: 12/14/2021  PHYSICAL THERAPY DISCHARGE SUMMARY  Visits from Start of Care: 4  Current functional level related to goals / functional outcomes: Unable to determine   Remaining deficits: N/A   Education / Equipment: HEP initiated   Patient agrees to discharge. Patient goals were  N/A . Patient is being discharged due to not returning since the last visit.  Dion Body, PT 12/14/2021, 4:24 PM  [place] [facility] [address] [place], [place], [address] Phone: [phone]   Fax:  [phone]

## 2023-01-30 IMAGING — CR DG CHEST 2V
2 series · 2 of 2 positions shown · non-contrast
Comparison: 07/18/2020

CLINICAL DATA: Shortness of breath

EXAM:
CHEST - 2 VIEW

[w chest pa]
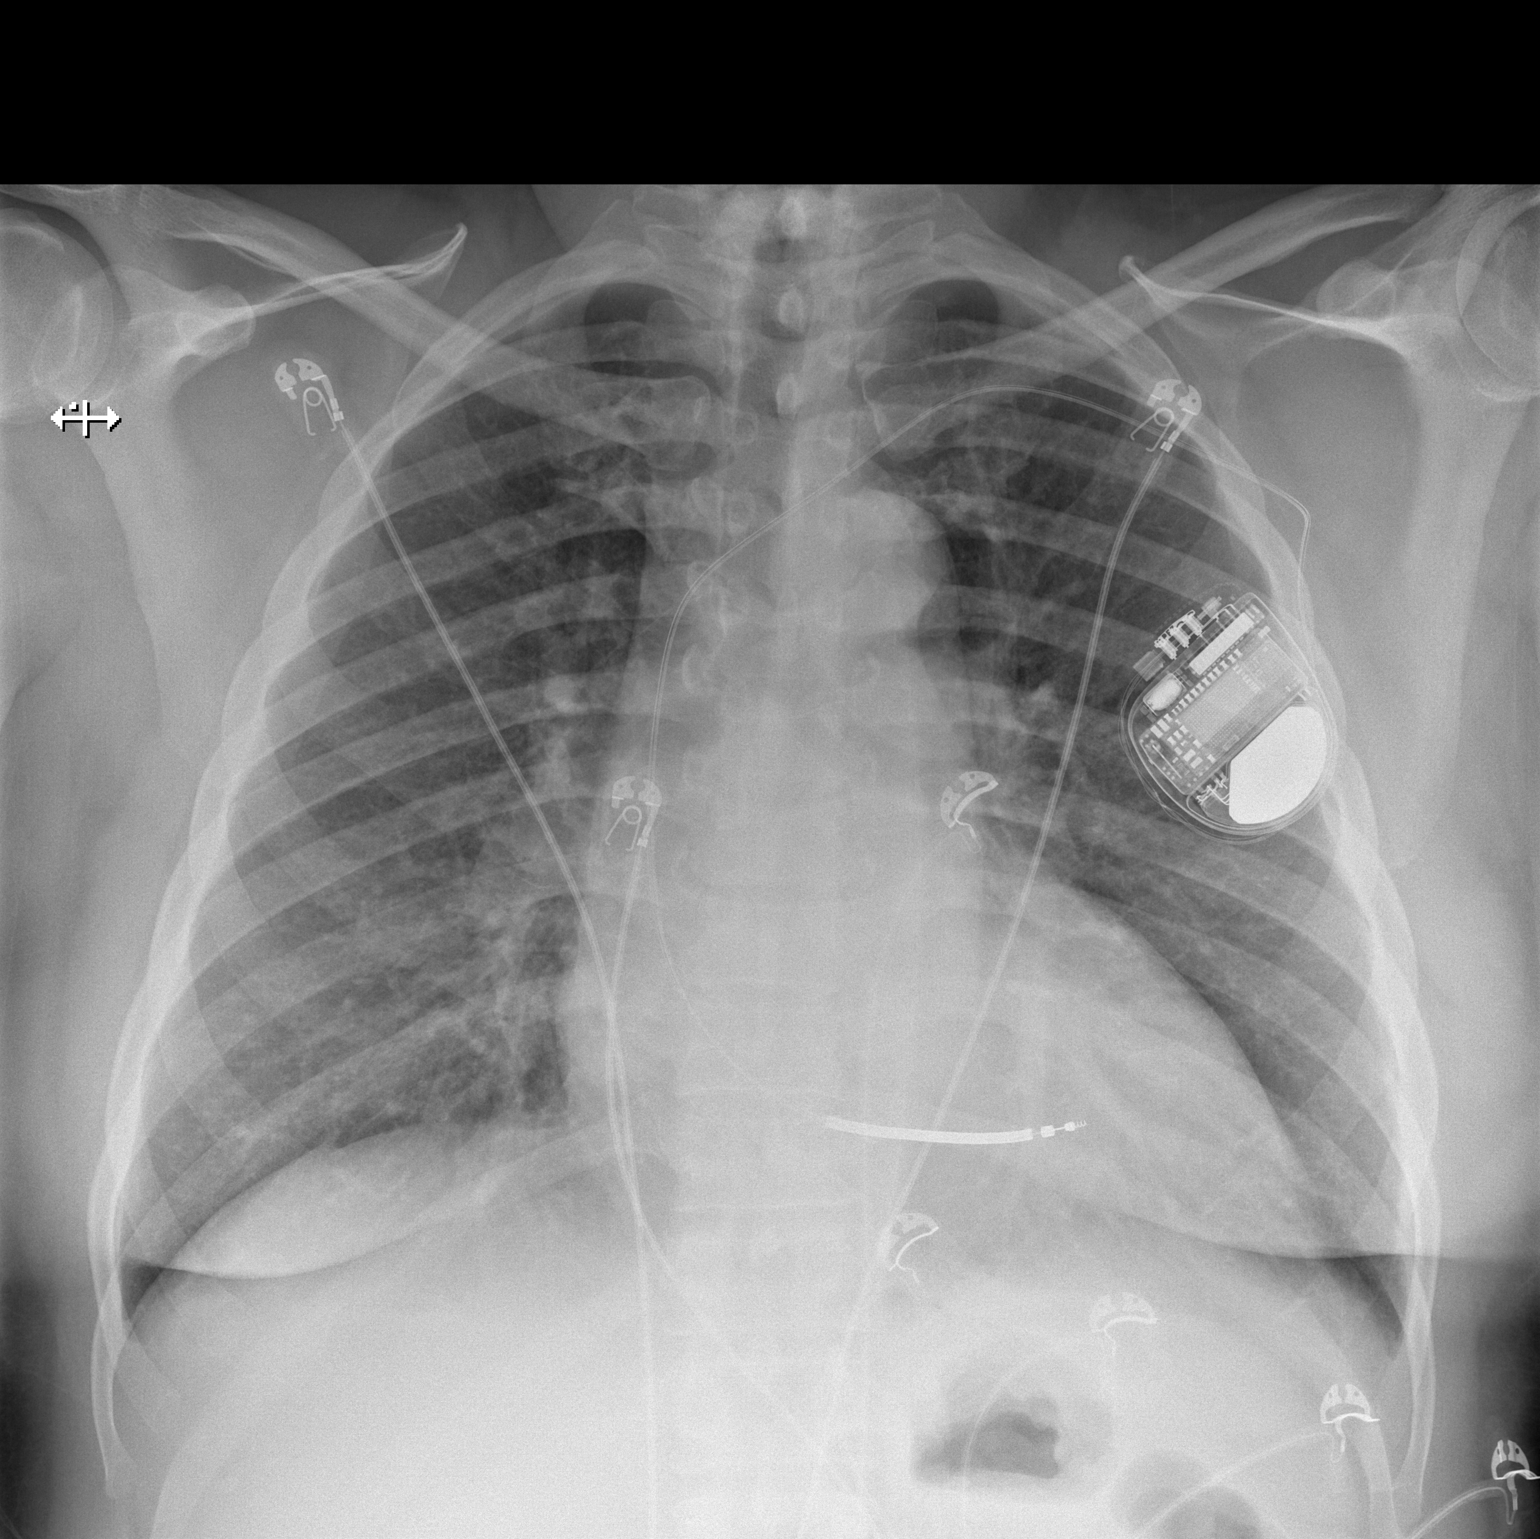

[w chest lat]
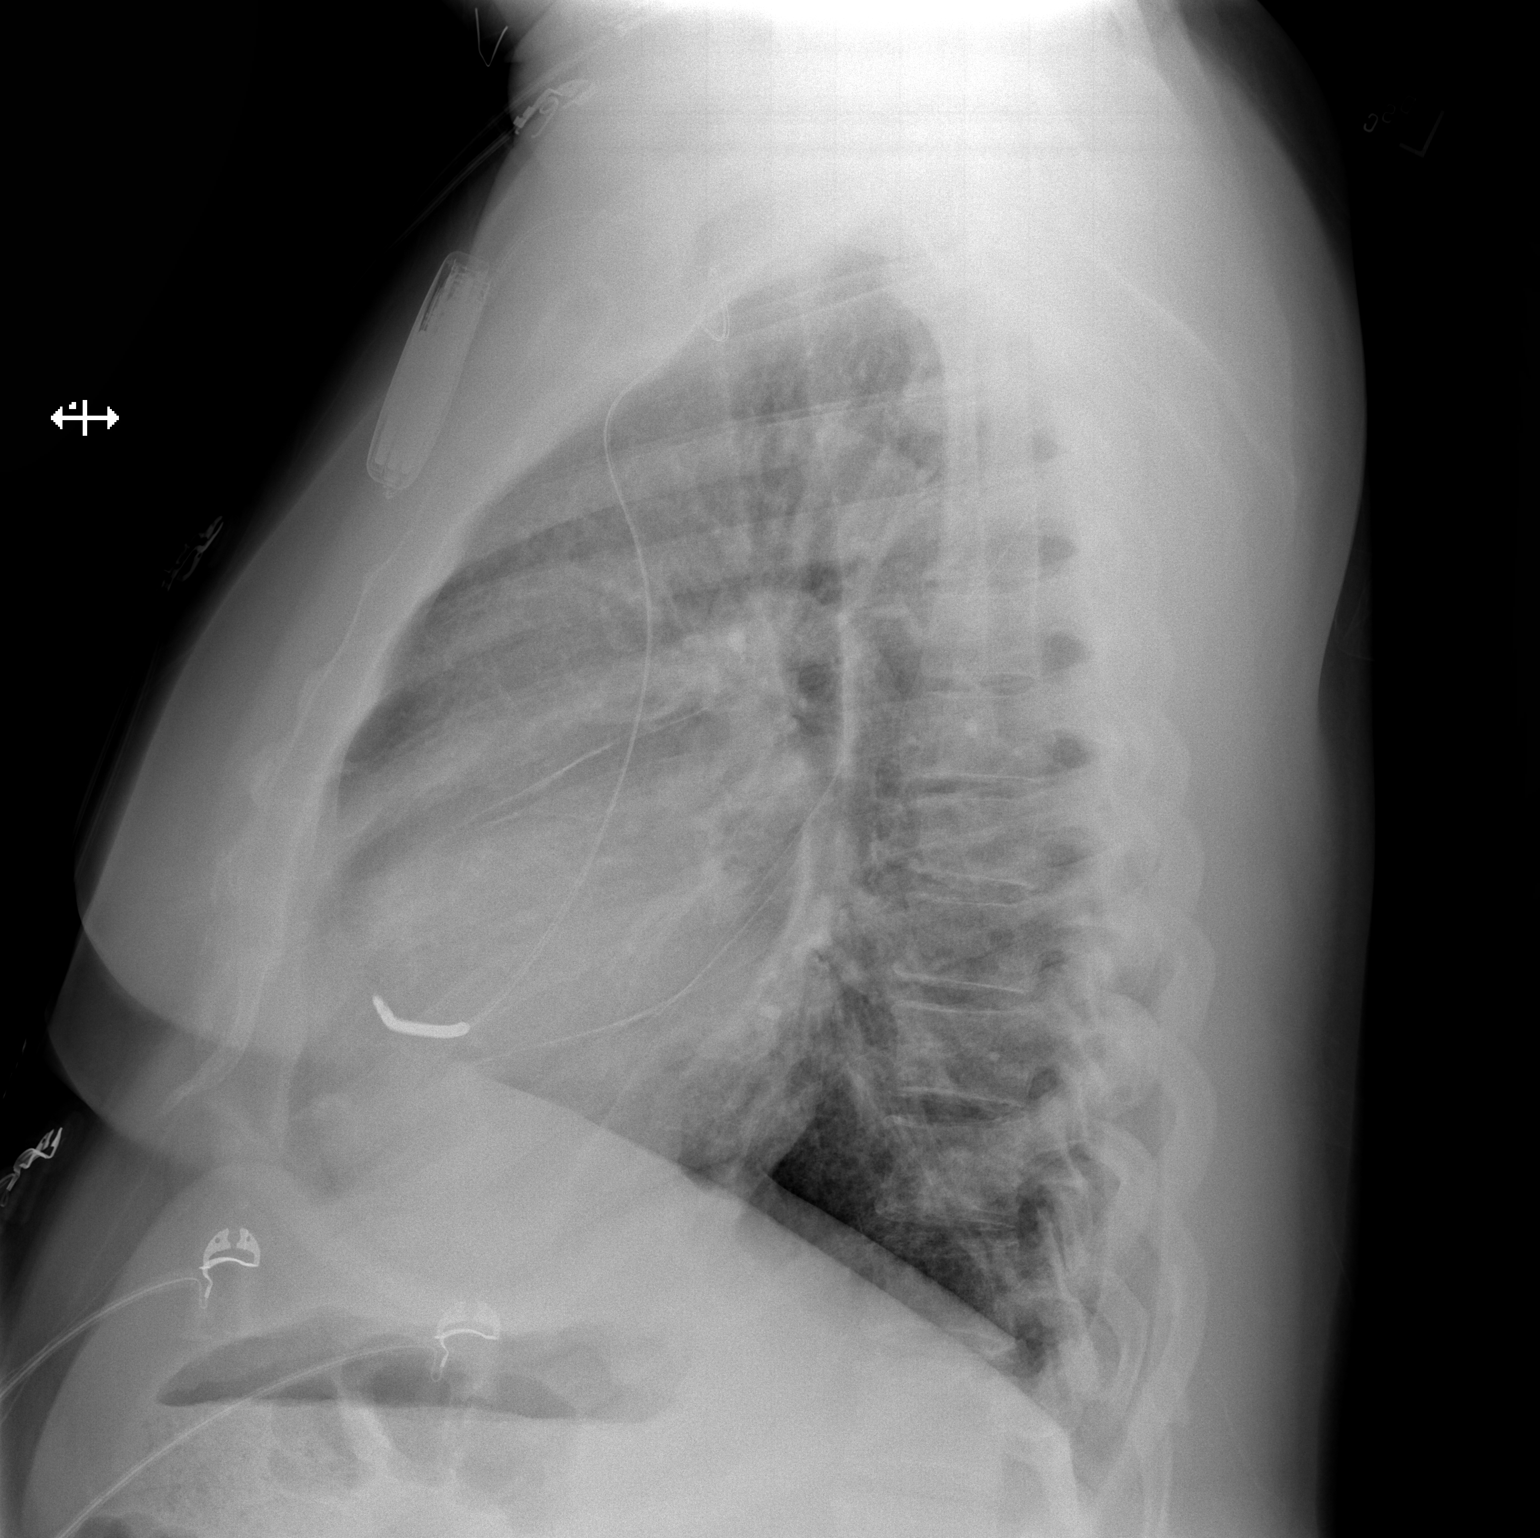

[2 of 2 positions shown; findings below may reference images not displayed]

FINDINGS: Cardiomegaly with left chest single lead pacer defibrillator. Both
lungs are clear. The visualized skeletal structures are
unremarkable.
IMPRESSION: Cardiomegaly without acute abnormality of the lungs.

## 2023-05-28 IMAGING — CR DG CHEST 2V
2 series · 2 of 2 positions shown · non-contrast
Comparison: 01/24/2021

CLINICAL DATA: Cough, short of breath for 2 weeks

EXAM:
CHEST - 2 VIEW

[w chest lat]
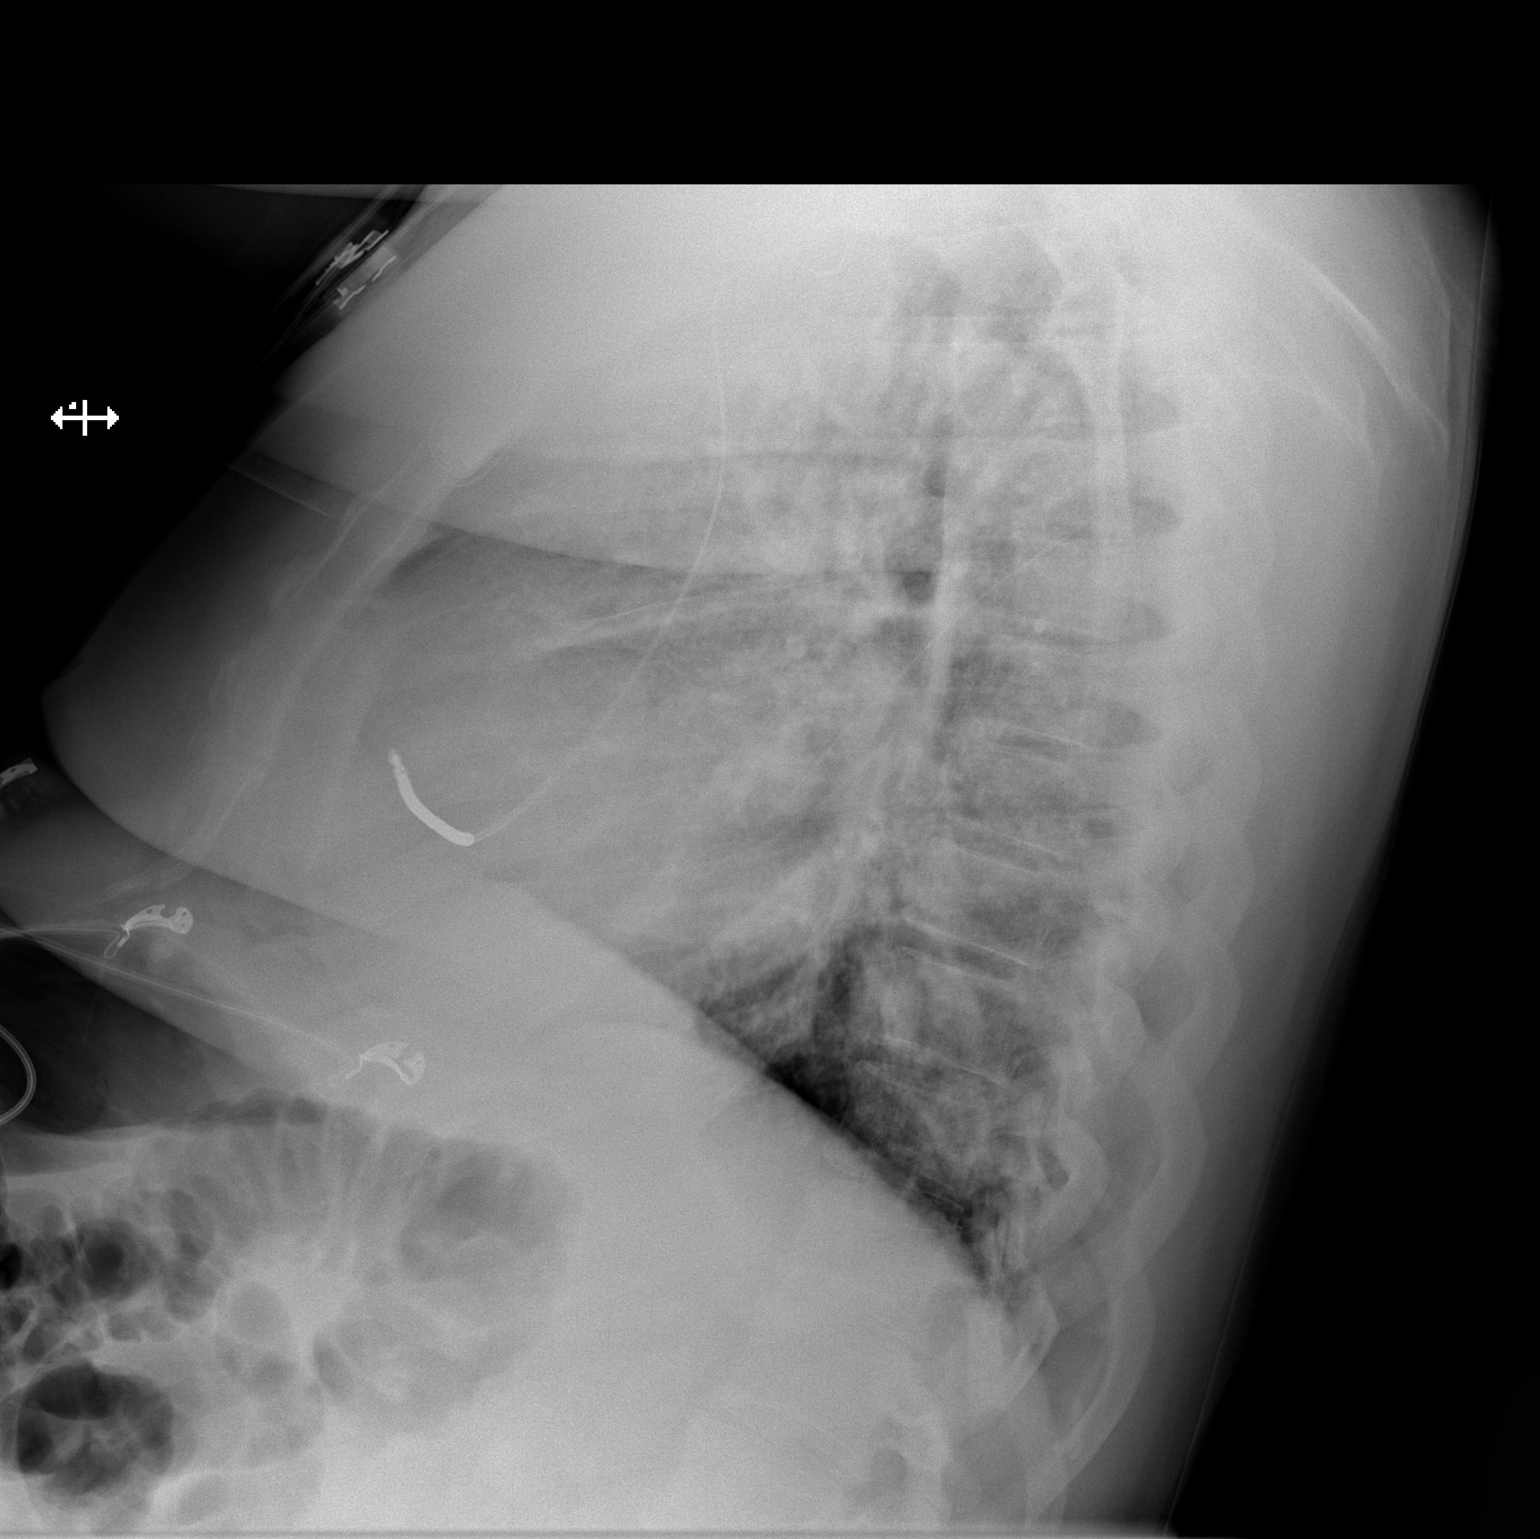

[x chest ap]
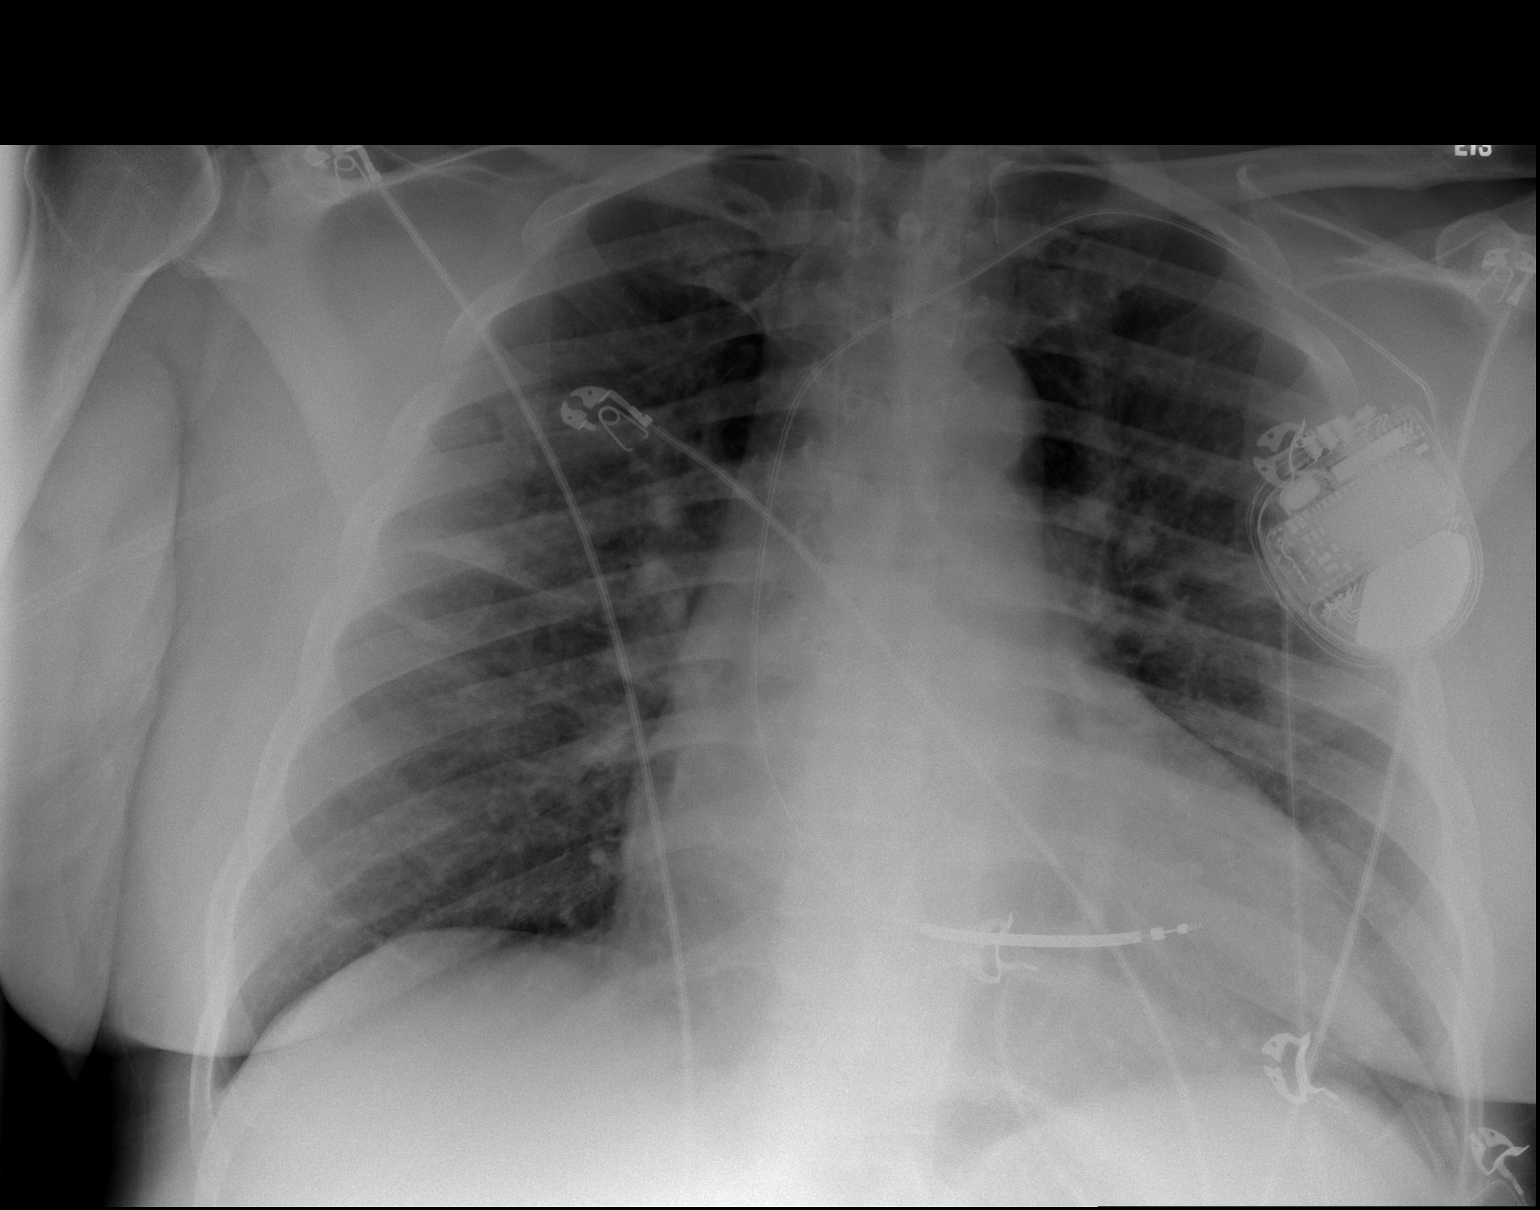

[2 of 2 positions shown; findings below may reference images not displayed]

FINDINGS: Frontal and lateral views of the chest demonstrate a single lead
pacer/AICD. Stable enlargement of the cardiac silhouette. There is
patchy right upper lobe airspace disease abutting the minor fissure.
No effusion or pneumothorax. No acute bony abnormalities.
IMPRESSION: 1. Patchy right upper lobe airspace disease abutting the minor
fissure, consistent with bronchopneumonia.
2. Stable enlarged cardiac silhouette.
